# Patient Record
Sex: Female | Born: 1970
Health system: Southern US, Community
[De-identification: ages and names within clinical notes are randomized; demographics above are authoritative.]

## PROBLEM LIST (undated history)

## (undated) ENCOUNTER — Emergency Department (HOSPITAL_COMMUNITY): Admission: EM | Discharge status: Absent without leave | Payer: Self-pay

## (undated) DIAGNOSIS — J189 Pneumonia, unspecified organism: Secondary | ICD-10-CM

## (undated) DIAGNOSIS — K219 Gastro-esophageal reflux disease without esophagitis: Secondary | ICD-10-CM

## (undated) DIAGNOSIS — E119 Type 2 diabetes mellitus without complications: Secondary | ICD-10-CM

## (undated) DIAGNOSIS — M109 Gout, unspecified: Secondary | ICD-10-CM

## (undated) HISTORY — PX: CHOLECYSTECTOMY: SHX55

## (undated) HISTORY — PX: BREAST SURGERY: SHX581

---

## 2004-08-28 ENCOUNTER — Emergency Department: Payer: Self-pay | Admitting: Emergency Medicine

## 2005-09-24 ENCOUNTER — Other Ambulatory Visit: Payer: Self-pay

## 2005-09-24 ENCOUNTER — Emergency Department: Payer: Self-pay | Admitting: Emergency Medicine

## 2006-10-02 ENCOUNTER — Ambulatory Visit: Payer: Self-pay | Admitting: Family Medicine

## 2006-10-04 ENCOUNTER — Encounter: Payer: Self-pay | Admitting: Family Medicine

## 2006-10-09 ENCOUNTER — Encounter: Payer: Self-pay | Admitting: Family Medicine

## 2007-01-22 ENCOUNTER — Encounter (INDEPENDENT_AMBULATORY_CARE_PROVIDER_SITE_OTHER): Payer: Self-pay | Admitting: Specialist

## 2007-01-22 ENCOUNTER — Ambulatory Visit (HOSPITAL_BASED_OUTPATIENT_CLINIC_OR_DEPARTMENT_OTHER): Admission: RE | Admit: 2007-01-22 | Discharge: 2007-01-23 | Payer: Self-pay | Admitting: Specialist

## 2007-02-01 ENCOUNTER — Emergency Department: Payer: Self-pay | Admitting: Emergency Medicine

## 2007-06-04 ENCOUNTER — Emergency Department: Payer: Self-pay | Admitting: Emergency Medicine

## 2008-09-03 ENCOUNTER — Emergency Department: Payer: Self-pay | Admitting: Emergency Medicine

## 2008-12-16 IMAGING — CR DG THORACIC SPINE 2-3V
1 series · 3 of 3 positions shown · non-contrast
Comparison: none

REASON FOR EXAM: PAIN
COMMENTS:

[Series 1: view not recorded · 0.17mm/px · 3 of 3 slices shown]
[im 1/3]
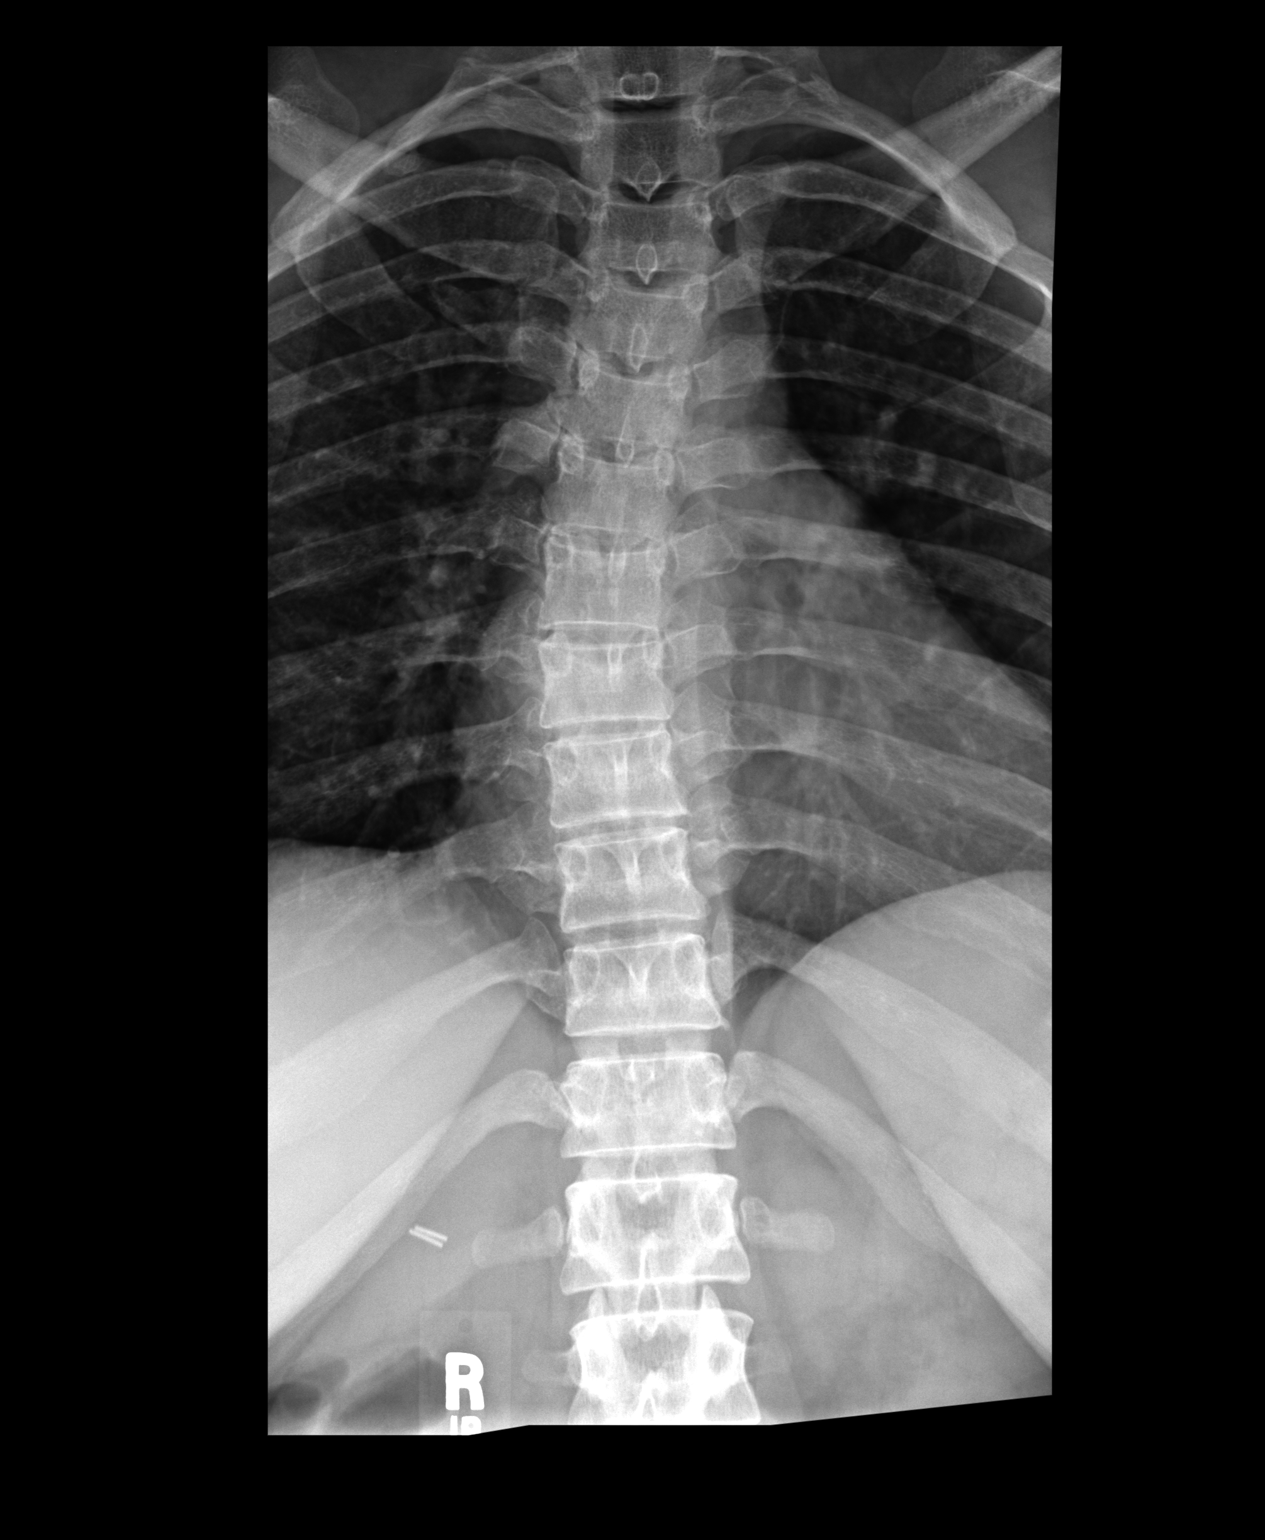
[im 2/3]
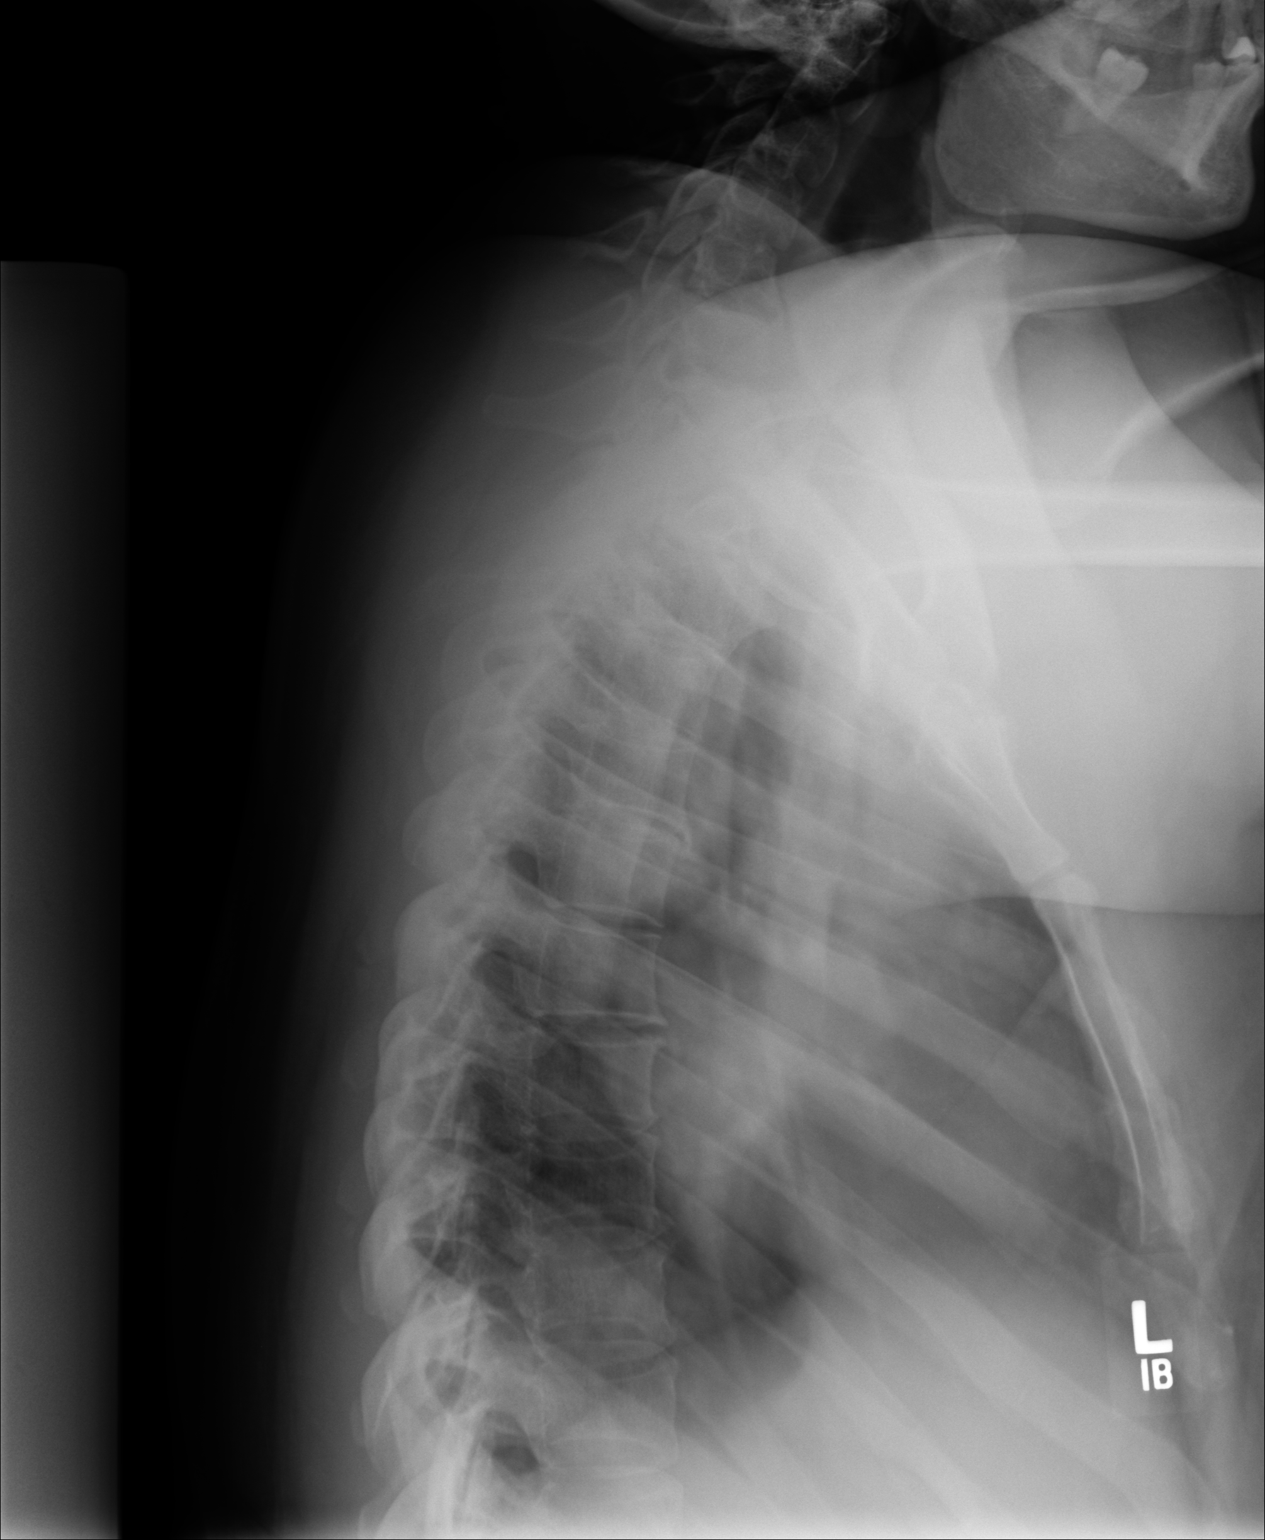
[im 3/3]
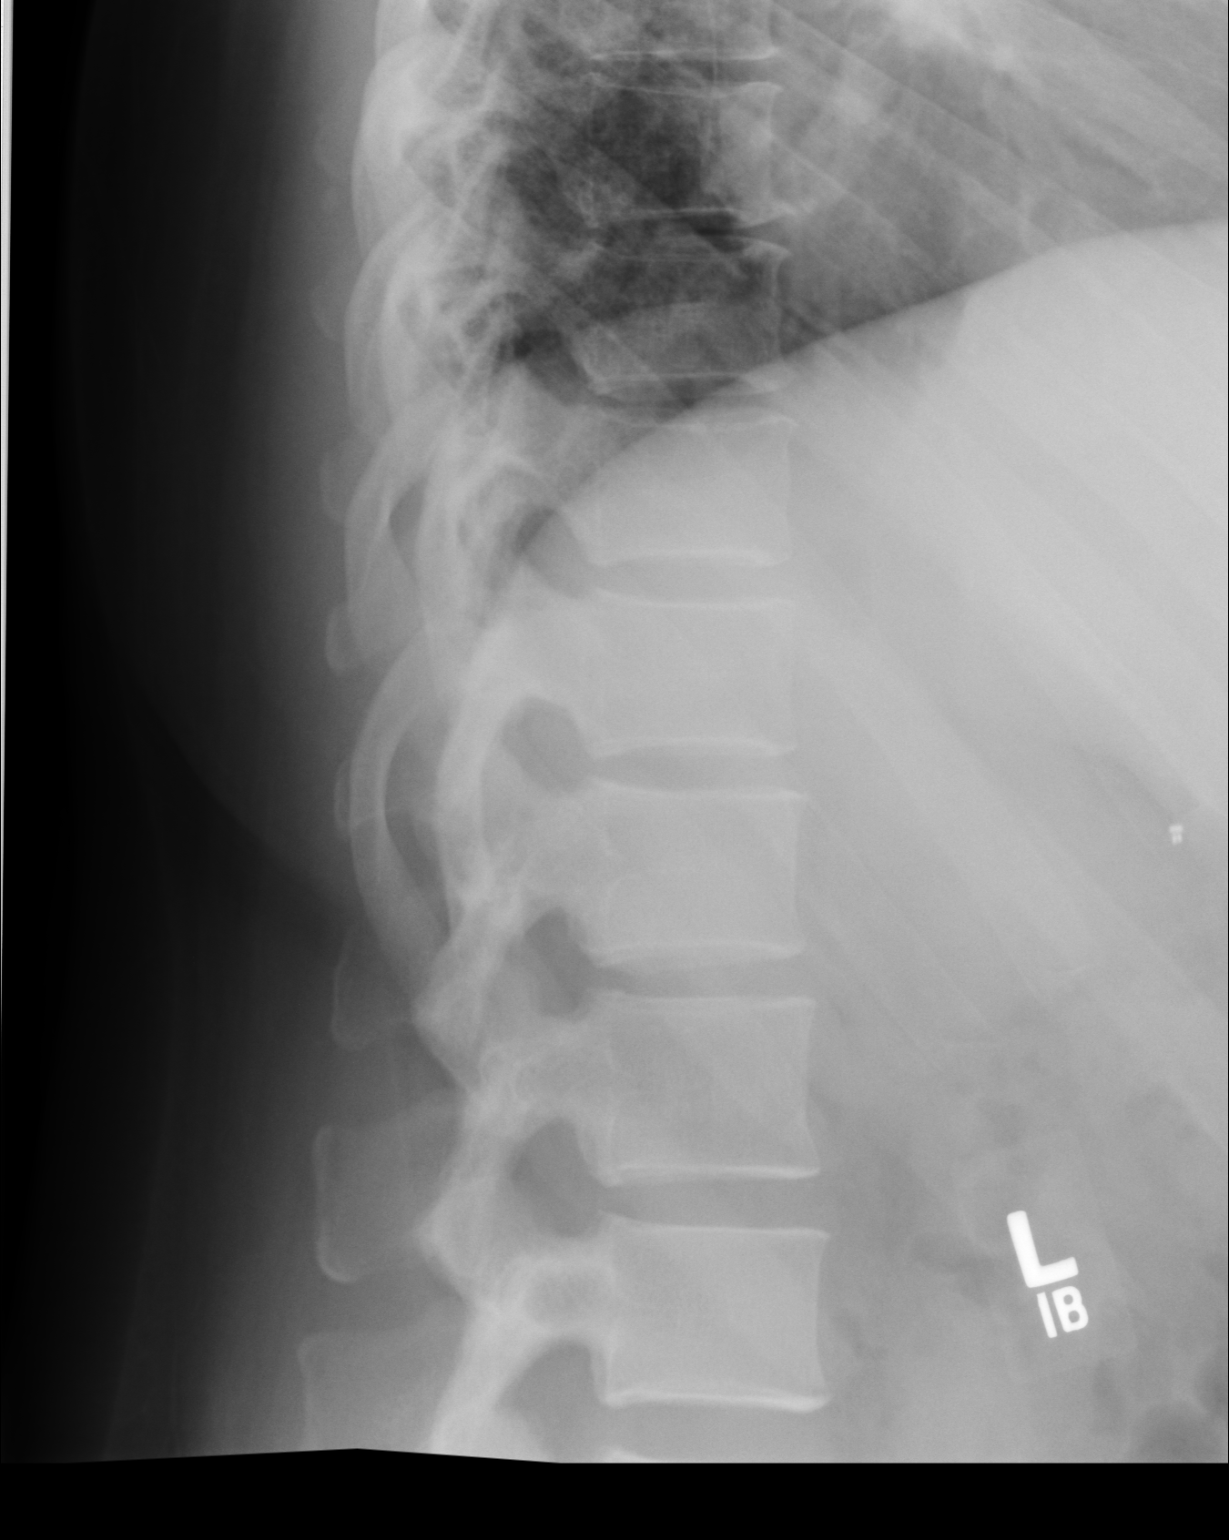

[3 of 3 positions shown; findings below may reference images not displayed]

PROCEDURE:     DXR - DXR THORACIC  AP AND LATERAL  - October 02, 2006 [DATE]

RESULT:     The vertebral body heights and the intervertebral disc spaces
are well maintained. There is a mild thoracic scoliosis with a convexity to
the RIGHT and measuring approximately 14 degrees. In the lateral view, there
is noted slightly hypertrophic spurring anteriorly at multiple levels of the
thoracic spine.
IMPRESSION: 1. No fracture is seen.
2. No lytic or blastic lesions are noted.
3. There is a mild thoracic scoliosis centered at T7 and having the
convexity to the RIGHT.
4. Mild degenerative spurring is noted at multiple levels.

## 2009-09-29 ENCOUNTER — Emergency Department: Payer: Self-pay | Admitting: Emergency Medicine

## 2009-11-27 ENCOUNTER — Ambulatory Visit: Payer: Self-pay | Admitting: Internal Medicine

## 2010-01-26 ENCOUNTER — Ambulatory Visit: Payer: Self-pay | Admitting: Internal Medicine

## 2010-04-27 ENCOUNTER — Emergency Department: Payer: Self-pay | Admitting: Emergency Medicine

## 2010-06-22 NOTE — Op Note (Signed)
NAMEWATEEN, VARON           ACCOUNT NO.:  [id number]   MEDICAL RECORD NO.:  [id number]          PATIENT TYPE:  AMB   LOCATION:  DSC                          FACILITY:  [facility]   PHYSICIAN:  Earvin Hansen L. Truesdale, M.D.DATE OF BIRTH:  January 12, 1971   DATE OF PROCEDURE:  01/22/2007  DATE OF DISCHARGE:                               OPERATIVE REPORT   This 40 year old lady with severe, severe macromastia, back and shoulder  pain secondary to large pendulous breasts, intertriginous changes caused  by increased macromastia as well as increased accessory breast tissue  especially in the upper axillary regions going all around to the  latissimus dorsi areas.  The patient has used multiple talcs, creams,  etc. to try to help her intertriginous changes without improvement.  She  has worn special bras etc. without improvement.   PROCEDURES PLANNED:  Bilateral breast reductions using the inferior  pedicle technique.   Operative procedure in detail as well as attendant risks and possible  complications were explained to the patient preoperatively who consents  to surgery.  Preoperatively, the patient was sat up and drawn for the  inferior pedicle reduction mammoplasty, re-marking the nipple-areolar  complex from over 40 cm to 25 from the suprasternal notch.  She then  underwent general anesthesia intubated orally.  Prep was done to the  chest, breast, abdominal areas using Hibiclens soap and solution and  walled off with sterile towels and drapes so as to make a sterile field.  0.25% Xylocaine with epinephrine 1:400,000 concentration was injected  locally for vasoconstriction.  The wounds were scored with #15 blade and  skin over the inferior pedicle was de-epithelialized with #20 blade.  Medial and lateral fatty dermal pedicles were incised down to underlying  fascia.  After proper hemostasis the new keyhole area was also debulked  of  large volumes of breast tissue.  Laterally excision was  done  directly over accessory breast tissue in the axillary region as well as  latissimus dorsi areas.  After proper hemostasis, the flaps were  transposed and stayed with 3-0 Prolene.  Subcutaneous closure was done  with 3-0 Monocryl x2 layers in running subcuticular stitch of 3-0  Monocryl and 5-0 Monocryl throughout the inverted T.  The wounds were  drained with #10 fully fluted Blake drains which were placed in the  depths of the wound and brought through the lateral-most portion of the  incision and secured with 3-0 Prolene.  The wounds were cleansed.  Steri-  Strips and soft dressing applied all the areas.  Periareolar tissues and  nipple were examined with excellent blood supply and suppleness.  After  sterile dressing were placed and Hypafix the patient was then taken to  the recovery room in excellent condition.  Estimated blood loss less  than 150 mL.  Complications none.      Yaakov Guthrie. Shon Hough, M.D.  Electronically Signed     GLT/MEDQ  D:  01/22/2007  T:  01/23/2007  Job:  [id number]

## 2010-08-03 ENCOUNTER — Ambulatory Visit: Payer: Self-pay | Admitting: Emergency Medicine

## 2010-11-12 LAB — POCT HEMOGLOBIN-HEMACUE: Operator id: 112821

## 2011-05-28 ENCOUNTER — Ambulatory Visit: Payer: Self-pay | Admitting: Internal Medicine

## 2011-07-23 ENCOUNTER — Emergency Department: Payer: Self-pay | Admitting: Emergency Medicine

## 2011-11-22 ENCOUNTER — Emergency Department: Payer: Self-pay | Admitting: Emergency Medicine

## 2012-08-30 ENCOUNTER — Emergency Department: Payer: Self-pay | Admitting: Emergency Medicine

## 2014-12-29 ENCOUNTER — Emergency Department
Admission: EM | Admit: 2014-12-29 | Discharge: 2014-12-29 | Disposition: A | Discharge status: Home / Self Care | Payer: Medicaid Other | Attending: Emergency Medicine | Admitting: Emergency Medicine

## 2014-12-29 ENCOUNTER — Encounter: Payer: Self-pay | Admitting: *Deleted

## 2014-12-29 DIAGNOSIS — S025XXA Fracture of tooth (traumatic), initial encounter for closed fracture: Secondary | ICD-10-CM

## 2014-12-29 DIAGNOSIS — Z880 Allergy status to penicillin: Secondary | ICD-10-CM | POA: Insufficient documentation

## 2014-12-29 DIAGNOSIS — K0381 Cracked tooth: Secondary | ICD-10-CM | POA: Diagnosis not present

## 2014-12-29 DIAGNOSIS — K0889 Other specified disorders of teeth and supporting structures: Secondary | ICD-10-CM | POA: Diagnosis present

## 2014-12-29 DIAGNOSIS — K029 Dental caries, unspecified: Secondary | ICD-10-CM | POA: Diagnosis not present

## 2014-12-29 MED ORDER — IBUPROFEN 800 MG PO TABS: 800.0000 mg | ORAL_TABLET | Freq: Three times a day (TID) | ORAL | Status: DC | PRN

## 2014-12-29 MED ORDER — AZITHROMYCIN 250 MG PO TABS: ORAL_TABLET | ORAL | Status: DC

## 2014-12-29 NOTE — Discharge Instructions (Signed)
Tooth Injuries °Tooth injuries (tooth trauma) include cracked or broken teeth (fractures), teeth that have been moved out of place or dislodged (luxations), and knocked-out teeth (avulsions). °A tooth injury often needs to be treated quickly to save the tooth. However, sometimes it is not possible to save a tooth after an injury, so the tooth may need to be removed (extracted). °CAUSES °Tooth injuries may be caused by any force that is strong enough to chip, break, dislodge, or knock out a tooth. Forces may be due to: °· Sports injuries. °· Falls. °· Accidents. °· Fights. °RISK FACTORS °You may be more likely to injure a tooth if you play a contact sport without using a mouthguard. °SYMPTOMS °A tooth that is forced into the gum may appear dislodged or moved out of position into the tooth socket. A fractured tooth may not be as obvious. Symptoms of a tooth injury include: °· Pain, especially with chewing. °· A loose tooth. °· Bleeding in or around the tooth. °· Swelling or bruising near the tooth. °· Swelling or bruising of the lip over the injured tooth. °· Increased sensitivity to heat and cold. °DIAGNOSIS °A tooth injury can be diagnosed with a medical history and a physical exam. You may also need dental X-rays to check for injuries to the root of the tooth. °TREATMENT °Treatment depends on the type of injury you have and how bad it is. Treatment may need to be done quickly to save your tooth. Possible treatments include: °· Replacing a tooth fragment with a filling, cap, or hard, protective cover (crown). This may be an option for a chip or fracture that does not involve the inside of your tooth (pulp). °· Having a procedure to repair the inside of the tooth (root canal) and then having a crown placed on top. This may be done to treat a tooth fracture that involves the pulp. °· Repositioning a dislodged tooth, then doing a root canal. The root canal usually needs to be done within a few days of the  injury. °· Replacing a knocked-out tooth in the socket, if possible, then doing a root canal a few weeks later. °· Tooth extraction for a fracture that extends below your gumline or splits your tooth completely. °HOME CARE INSTRUCTIONS °· Take medicines only as directed by your dental provider or health care provider. °· Keep all follow-up visits as directed by your dental provider or health care provider. This is important. °· Do not eat or chew on very hard objects. These include ice cubes, pens, pencils, hard candy, and popcorn kernels. °· Do not clench or grind your teeth. Tell your dental provider or health care provider if you grind your teeth while you sleep. °· Apply ice to your mouth near the injured tooth as directed by your dental provider or health care provider. °· Follow instructions about rinsing your mouth with salt water as directed by your dental provider or health care provider. °· Do not use your teeth to open packages. °· Always wear mouth protection when you play contact sports. °SEEK MEDICAL CARE IF: °· You continue to have tooth pain after a tooth injury. °· Your tooth is sensitive to heat and cold. °· You develop swelling near your injured tooth. °· You have a fever. °· You are unable to open your jaw. °· You are drooling and it is getting worse. °  °This information is not intended to replace advice given to you by your health care provider. Make sure you discuss any   questions you have with your health care provider.   Document Released: 10/22/2003 Document Revised: 06/10/2014 Document Reviewed: 01/20/2014 Elsevier Interactive Patient Education 2016 [employer].  OPTIONS FOR DENTAL FOLLOW UP CARE  [place] Department of Health and Human Services - Local Safety Net Dental Clinics [email or web address]   [facility] [phone])  Sharl Ma [phone])  [place] [phone] ext [phone])  [facility] [phone])  [facility] [phone]) This clinic caters to the indigent population and is on a lottery system. Location: [employer] [facility], [employer], [address] [place] Clinic Hours: Wednesdays from 6pm - 9pm, patients seen by a lottery system. For dates, call or go to [email or web address] Services: Cleanings, fillings and simple extractions. Payment Options: DENTAL WORK IS FREE OF CHARGE. Bring proof of income or support. Best way to get seen: Arrive at 5:15 pm - this is a lottery, NOT first come/first serve, so arriving earlier will not increase your chances of being seen.     [facility] [phone] Select option 1 for emergencies   Location: [facility], [place], [address] [place] Clinic Hours: No walk-ins accepted - call the day before to schedule an appointment. Check in times are 9:30 am and 1:30 pm. Services: Simple extractions, temporary fillings, pulpectomy/pulp debridement, uncomplicated abscess drainage. Payment Options: PAYMENT IS DUE AT THE TIME OF SERVICE.  Fee is usually $100-200, additional surgical procedures (e.g. abscess drainage) may be extra. Cash, checks, Visa/MasterCard accepted.  Can file Medicaid if patient is covered for dental - patient should call case worker to check. No discount for [facility] patients. Best way to get seen: MUST call the day before and get onto the schedule. Can usually be seen the next 1-2 days. No walk-ins accepted.     [facility] [phone]   Location: [facility], [address] [place] Clinic Hours: M, W, Th, F 8am or 1:30pm, Tues 9a or 1:30 - first come/first served. Services: Simple extractions, temporary fillings, uncomplicated abscess drainage.  You do not need to be an [facility] resident. Payment Options: PAYMENT IS DUE AT THE TIME  OF SERVICE. Dental insurance, otherwise sliding scale - bring proof of income or support. Depending on income and treatment needed, cost is usually $50-200. Best way to get seen: Arrive early as it is first come/first served.     [facility] [phone]   Location: [address] Clinic Hours: Mon-Thu 8a-5p Services: Most basic dental services including extractions and fillings. Payment Options: PAYMENT IS DUE AT THE TIME OF SERVICE. Sliding scale, up to 50% off - bring proof if income or support. Medicaid with dental option accepted. Best way to get seen: Call to schedule an appointment, can usually be seen within 2 weeks OR they will try to see walk-ins - show up at 8a or 2p (you may have to wait).     [facility] [phone] [place] RESIDENTS ONLY   Location: [facility], [address], [place], [place] [address] Clinic Hours: By appointment only. Monday - Thursday 8am-5pm, Friday 8am-12pm Services: Cleanings, fillings, extractions. Payment Options: PAYMENT IS DUE AT THE TIME OF SERVICE. Cash, Visa or MasterCard. Sliding scale - $30 minimum per service. Best way to get seen: Come in to office, complete packet and make an appointment - need proof of income or support monies for each household member and proof  of [place] residence. Usually takes about a month to get in.     [facility] [phone]   Location: [address] [facility] Hours: Walk-in Urgent Care Dental Services are offered Monday-Friday mornings only. The numbers of emergencies accepted daily is limited to the number of providers available. Maximum 15 - Mondays, Wednesdays & Thursdays Maximum 10 - Tuesdays & Fridays Services: You do not need to be a [place] resident to be seen for a dental emergency. Emergencies are defined as pain, swelling, abnormal  bleeding, or dental trauma. Walkins will receive x-rays if needed. NOTE: Dental cleaning is not an emergency. Payment Options: PAYMENT IS DUE AT THE TIME OF SERVICE. Minimum co-pay is $40.00 for uninsured patients. Minimum co-pay is $3.00 for Medicaid with dental coverage. Dental Insurance is accepted and must be presented at time of visit. Medicare does not cover dental. Forms of payment: Cash, credit card, checks. Best way to get seen: If not previously registered with the clinic, walk-in dental registration begins at 7:15 am and is on a first come/first serve basis. If previously registered with the clinic, call to make an appointment.     [facility] [phone] [place] RESIDENTS ONLY   Location: [address], [place], [place] Clinic Hours: Mon-Thu 10a-2p Services: Extractions only! Payment Options: FREE (donations accepted) - bring proof of income or support Best way to get seen: Call and schedule an appointment OR come at 8am on the 1st Monday of every month (except for holidays) when it is first come/first served.     [facility] [phone]   Location: [address], [place] Clinic Hours: Friday mornings Services, Payment Options, Best way to get seen: Call for info

## 2014-12-29 NOTE — ED Notes (Signed)
Left lower dental pain x 1 week.  Reports that she chipped her tooth.  No swelling noted.

## 2014-12-29 NOTE — ED Provider Notes (Signed)
[facility] Emergency Department Provider Note  ____________________________________________  Time seen: Approximately 7:38 PM  I have reviewed the triage vital signs and the nursing notes.   HISTORY  Chief Complaint Dental Pain    HPI Cynthia Parsons is a 44 y.o. female who presents for evaluation of dental pain. Patient states that she chipped tooth and has been pain for 1 week.   History reviewed. No pertinent past medical history.  There are no active problems to display for this patient.   History reviewed. No pertinent past surgical history.  Current Outpatient Rx  Name  Route  Sig  Dispense  Refill  . azithromycin (ZITHROMAX Z-PAK) 250 MG tablet      Take 2 tablets (500 mg) on  Day 1,  followed by 1 tablet (250 mg) once daily on Days 2 through 5.   6 each   0   . ibuprofen (ADVIL,MOTRIN) 800 MG tablet   Oral   Take 1 tablet (800 mg total) by mouth every 8 (eight) hours as needed.   30 tablet   0     Allergies Penicillins  History reviewed. No pertinent family history.  Social History Social History  Substance Use Topics  . Smoking status: Never Smoker   . Smokeless tobacco: None  . Alcohol Use: No    Review of Systems Constitutional: No fever/chills Eyes: No visual changes. ENT: No sore throat. Positive for dental pain Cardiovascular: Denies chest pain. Respiratory: Denies shortness of breath. Gastrointestinal: No abdominal pain.  No nausea, no vomiting.  No diarrhea.  No constipation. Genitourinary: Negative for dysuria. Musculoskeletal: Negative for back pain. Skin: Negative for rash. Neurological: Negative for headaches, focal weakness or numbness.  10-point ROS otherwise negative.  ____________________________________________   PHYSICAL EXAM:  VITAL SIGNS: ED Triage Vitals  Enc Vitals Group     BP 12/29/14 1920 123/70 mmHg     Pulse Rate 12/29/14 1920 74     Resp 12/29/14 1920 18     Temp 12/29/14  1920 98.3 F (36.8 C)     Temp Source 12/29/14 1920 Oral     SpO2 12/29/14 1920 100 %     Weight 12/29/14 1920 220 lb (99.791 kg)     Height 12/29/14 1920  (1.676 m)     Head Cir --      Peak Flow --      Pain Score 12/29/14 1916 5     Pain Loc --      Pain Edu? --      Excl. in GC? --     Constitutional: Alert and oriented. Well appearing and in no acute distress. Head: Atraumatic. Nose: No congestion/rhinnorhea. Mouth/Throat: Mucous membranes are moist.  Oropharynx non-erythematous. Fractured tooth left lower molar. No evidence of erythema or edema. Positive dental caries. Neck: No stridor.   Cardiovascular: Normal rate, regular rhythm. Grossly normal heart sounds.  Good peripheral circulation. Respiratory: Normal respiratory effort.  No retractions. Lungs CTAB. Neurologic:  Normal speech and language. No gross focal neurologic deficits are appreciated. No gait instability. Skin:  Skin is warm, dry and intact. No rash noted. Psychiatric: Mood and affect are normal. Speech and behavior are normal.  ____________________________________________   LABS (all labs ordered are listed, but only abnormal results are displayed)  Labs Reviewed - No data to display ____________________________________________  PROCEDURES  Procedure(s) performed: None  Critical Care performed: No  ____________________________________________   INITIAL IMPRESSION / ASSESSMENT AND PLAN / ED COURSE  Pertinent labs &  imaging results that were available during my care of the patient were reviewed by me and considered in my medical decision making (see chart for details).  Acute dental pain. Rx given for Motrin 800 mg 3 times a day and Z-Pak. Patient given a list of dental providers in the area. She is to follow-up with PCP or return to the ER as needed. ____________________________________________   FINAL CLINICAL IMPRESSION(S) / ED DIAGNOSES  Final diagnoses:  Fractured tooth, closed,  initial encounter      Evangeline Dakinharles M Rafik Koppel, PA-C 12/29/14 2309  Myrna Blazeravid Matthew Schaevitz, MD 12/29/14 [phone]

## 2016-05-19 ENCOUNTER — Emergency Department
Admission: EM | Admit: 2016-05-19 | Discharge: 2016-05-19 | Disposition: A | Discharge status: Home / Self Care | Payer: Medicaid Other | Attending: Emergency Medicine | Admitting: Emergency Medicine

## 2016-05-19 ENCOUNTER — Encounter: Payer: Self-pay | Admitting: Emergency Medicine

## 2016-05-19 ENCOUNTER — Emergency Department: Payer: Medicaid Other

## 2016-05-19 DIAGNOSIS — M19079 Primary osteoarthritis, unspecified ankle and foot: Secondary | ICD-10-CM

## 2016-05-19 DIAGNOSIS — M79671 Pain in right foot: Secondary | ICD-10-CM | POA: Diagnosis present

## 2016-05-19 DIAGNOSIS — Z792 Long term (current) use of antibiotics: Secondary | ICD-10-CM | POA: Insufficient documentation

## 2016-05-19 DIAGNOSIS — M19071 Primary osteoarthritis, right ankle and foot: Secondary | ICD-10-CM | POA: Diagnosis not present

## 2016-05-19 MED ORDER — PREDNISONE 20 MG PO TABS
60.0000 mg | ORAL_TABLET | Freq: Once | ORAL | Status: AC
  Administered 2016-05-19: 60 mg via ORAL
  Filled 2016-05-19: qty 3

## 2016-05-19 MED ORDER — PREDNISONE 10 MG PO TABS: 10.0000 mg | ORAL_TABLET | Freq: Every day | ORAL | 0 refills | Status: DC

## 2016-05-19 NOTE — Discharge Instructions (Signed)
Please rest ice and elevate the foot. Use crutches as needed for ambulation. Take prednisone as prescribed.

## 2016-05-19 NOTE — ED Notes (Signed)
See triage note  States she developed pain to right foot on Sunday  Denies any injury  Able to walk with a limp  No  Deformity noted

## 2016-05-19 NOTE — ED Provider Notes (Signed)
[facility]-EMERGENCY DEPARTMENT Provider Note   CSN: [id number] Arrival date & time: 05/19/16  1814     History   Chief Complaint Chief Complaint  Patient presents with  . Foot Pain    HPI Cynthia Parsons is a 46 y.o. female presents for evaluation of acute right foot pain along the first MTP joint of the right foot. Pain has been present for 5 days. She denies any significant trauma. She is not taking any medications for pain. No numbness or tingling. Denies any fevers or open wounds. Pain is to the point where she is having a hard time ambulating.   HPI  History reviewed. No pertinent past medical history.  There are no active problems to display for this patient.   No past surgical history on file.  OB History    No data available       Home Medications    Prior to Admission medications   Medication Sig Start Date End Date Taking? Authorizing Provider  azithromycin (ZITHROMAX Z-PAK) 250 MG tablet Take 2 tablets (500 mg) on  Day 1,  followed by 1 tablet (250 mg) once daily on Days 2 through 5. 12/29/14   Evangeline Dakin, PA-C  ibuprofen (ADVIL,MOTRIN) 800 MG tablet Take 1 tablet (800 mg total) by mouth every 8 (eight) hours as needed. 12/29/14   Charmayne Sheer Beers, PA-C  predniSONE (DELTASONE) 10 MG tablet Take 1 tablet (10 mg total) by mouth daily. 6,5,4,3,2,1 six day taper 05/19/16   Evon Slack, PA-C    Family History No family history on file.  Social History Social History  Substance Use Topics  . Smoking status: Never Smoker  . Smokeless tobacco: Not on file  . Alcohol use No     Allergies   Penicillins   Review of Systems Review of Systems  Constitutional: Negative for fever.  Musculoskeletal: Positive for arthralgias, gait problem and joint swelling.     Physical Exam Updated Vital Signs BP (!) 120/95 (BP Location: Left Arm)   Pulse (!) 106   Temp 98.5 F (36.9 C) (Oral)   Resp 18   Ht  (1.676 m)   Wt 99.8 kg   LMP 04/18/2016  Comment: tubes tied  SpO2 98%   BMI 35.51 kg/m   Physical Exam  Constitutional: She appears well-developed and well-nourished.  HENT:  Head: Normocephalic and atraumatic.  Eyes: Conjunctivae and EOM are normal.  Neck: Normal range of motion.  Cardiovascular: Intact distal pulses.   Pulmonary/Chest: No respiratory distress.  Musculoskeletal:  Examination of the right foot shows patient has a bunion deformity at the present to be joint. She is tender slightly dorsally along the MTP joint and along the plantar aspect. Sensation is intact distally. 2+ dorsalis pedis pulse. No warmth or redness.     ED Treatments / Results  Labs (all labs ordered are listed, but only abnormal results are displayed) Labs Reviewed - No data to display  EKG  EKG Interpretation None       Radiology Dg Foot Complete Right  Result Date: 05/19/2016 CLINICAL DATA:  Right foot pain since Sunday. EXAM: RIGHT FOOT COMPLETE - 3+ VIEW COMPARISON:  None. FINDINGS: The joint spaces are maintained. There are mild degenerative changes at the first MTP joint with spurring and subchondral cyst versus erosion. The other MTP joints are normal. No midfoot or hindfoot degenerative changes. Small calcaneal heel spur. IMPRESSION: Mild degenerative changes at the first MTP joint with possible erosions. Could not exclude the  possibility of gout. No acute bony findings. Electronically Signed   By: Rudie Meyer M.D.   On: 05/19/2016 20:11    Procedures Procedures (including critical care time)  Medications Ordered in ED Medications  predniSONE (DELTASONE) tablet 60 mg (not administered)     Initial Impression / Assessment and Plan / ED Course  I have reviewed the triage vital signs and the nursing notes.  Pertinent labs & imaging results that were available during my care of the patient were reviewed by me and considered in my medical decision making (see chart for details).     46 year old female with arthritic  changes along the first MTP joint. She is started on a prednisone taper. She is given crutches to help with ambulation. She'll follow-up with podiatry if no improvement.  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 6,5,4,3,2,1 six day taper     Evon Slack, PA-C 05/19/16 2101    Phineas Semen, MD 05/19/16 2212

## 2016-05-19 NOTE — ED Triage Notes (Signed)
Pt reports right foot pain since Sunday. Pt denies injury.

## 2017-09-28 DIAGNOSIS — M109 Gout, unspecified: Secondary | ICD-10-CM

## 2017-09-28 HISTORY — DX: Gout, unspecified: M10.9

## 2017-09-29 ENCOUNTER — Encounter (HOSPITAL_COMMUNITY): Payer: Self-pay | Admitting: Emergency Medicine

## 2017-09-29 ENCOUNTER — Emergency Department (HOSPITAL_COMMUNITY)
Admission: EM | Admit: 2017-09-29 | Discharge: 2017-09-29 | Disposition: A | Discharge status: Home / Self Care | Payer: Medicaid Other | Attending: Emergency Medicine | Admitting: Emergency Medicine

## 2017-09-29 DIAGNOSIS — M62830 Muscle spasm of back: Secondary | ICD-10-CM

## 2017-09-29 DIAGNOSIS — M546 Pain in thoracic spine: Secondary | ICD-10-CM | POA: Diagnosis present

## 2017-09-29 HISTORY — DX: Gout, unspecified: M10.9

## 2017-09-29 MED ORDER — METHOCARBAMOL 500 MG PO TABS: 500.0000 mg | ORAL_TABLET | Freq: Three times a day (TID) | ORAL | 0 refills | Status: DC | PRN

## 2017-09-29 MED ORDER — OXYCODONE-ACETAMINOPHEN 5-325 MG PO TABS
1.0000 | ORAL_TABLET | Freq: Once | ORAL | Status: AC
  Administered 2017-09-29: 1 via ORAL
  Filled 2017-09-29: qty 1

## 2017-09-29 MED ORDER — ACETAMINOPHEN 325 MG PO TABS
650.0000 mg | ORAL_TABLET | Freq: Once | ORAL | Status: AC
  Administered 2017-09-29: 650 mg via ORAL
  Filled 2017-09-29: qty 2

## 2017-09-29 MED ORDER — METHOCARBAMOL 500 MG PO TABS
500.0000 mg | ORAL_TABLET | Freq: Once | ORAL | Status: AC
  Administered 2017-09-29: 500 mg via ORAL
  Filled 2017-09-29: qty 1

## 2017-09-29 MED ORDER — KETOROLAC TROMETHAMINE 60 MG/2ML IM SOLN
60.0000 mg | Freq: Once | INTRAMUSCULAR | Status: AC
  Administered 2017-09-29: 60 mg via INTRAMUSCULAR
  Filled 2017-09-29: qty 2

## 2017-09-29 MED ORDER — PREDNISONE 20 MG PO TABS: 40.0000 mg | ORAL_TABLET | Freq: Every day | ORAL | 0 refills | Status: AC

## 2017-09-29 MED ORDER — IBUPROFEN 600 MG PO TABS: 600.0000 mg | ORAL_TABLET | Freq: Three times a day (TID) | ORAL | 0 refills | Status: DC | PRN

## 2017-09-29 MED FILL — predniSONE 20 MG TABS: 20 | 5 days supply | Qty: 10 | Fill #0

## 2017-09-29 MED FILL — IBUPROFEN 600 MG TABLET: 600 | 5 days supply | Qty: 15 | Fill #0

## 2017-09-29 MED FILL — METHOCARBAMOL 500 MG TABLET: 500 | 4 days supply | Qty: 12 | Fill #0
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
## 2017-09-29 NOTE — ED Provider Notes (Signed)
[facility]-EMERGENCY DEPT Provider Note   CSN: [id number] Arrival date & time: 09/29/17  0746     History   Chief Complaint Chief Complaint  Patient presents with  . Back Pain    HPI Cynthia Parsons is a 47 y.o. female.  HPI [age] year old female who presents with acute left upper back pain.  Is been present over the past several days.  This morning it significantly worsened.  She tried over-the-counter medications without improvement in her symptoms.  No weakness of her arms or legs.  No fevers or chills.  No chest pain or shortness of breath.  She states is located under her left scapula is worse with movement and palpation of that region.  She is currently stating that the heat pad which is present is helping.   Past Medical History:  Diagnosis Date  . Gout 09/28/2017    There are no active problems to display for this patient.   History reviewed. No pertinent surgical history.   OB History   None      Home Medications    Prior to Admission medications   Medication Sig Start Date End Date Taking? Authorizing Provider  ibuprofen (ADVIL,MOTRIN) 600 MG tablet Take 1 tablet (600 mg total) by mouth every 8 (eight) hours as needed. 09/29/17   Azalia Bilisampos, Nayel Purdy, MD  methocarbamol (ROBAXIN) 500 MG tablet Take 1 tablet (500 mg total) by mouth every 8 (eight) hours as needed for muscle spasms. 09/29/17   Azalia Bilisampos, Susumu Hackler, MD  predniSONE (DELTASONE) 20 MG tablet Take 2 tablets (40 mg total) by mouth daily for 5 days. 09/29/17 10/04/17  Azalia Bilisampos, Amaiyah Nordhoff, MD    Family History Family History  Problem Relation Age of Onset  . Hypertension Mother   . Diabetes Mother   . Stroke Mother   . Hypertension Father   . Diabetes Father     Social History Social History   Tobacco Use  . Smoking status: Never Smoker  . Smokeless tobacco: Never Used  Substance Use Topics  . Alcohol use: No  . Drug use: No     Allergies   Penicillins   Review of Systems Review  of Systems  All other systems reviewed and are negative.    Physical Exam Updated Vital Signs BP (!) 151/94 (BP Location: Left Arm)   Pulse 71   Temp 98.1 F (36.7 C) (Oral)   Resp 18   Ht 5\' 6"  (1.676 m)   Wt 106.1 kg   LMP 08/29/2017   SpO2 97%   BMI 37.77 kg/m   Physical Exam  Constitutional: She is oriented to person, place, and time. She appears well-developed and well-nourished.  HENT:  Head: Normocephalic.  Eyes: EOM are normal.  Neck: Normal range of motion.  Cardiovascular: Normal rate and regular rhythm.  Pulmonary/Chest: Effort normal and breath sounds normal.  Abdominal: Soft. She exhibits no distension.  Musculoskeletal: Normal range of motion.  Parathoracic tenderness on the left without significant spasm.  Neurological: She is alert and oriented to person, place, and time.  Psychiatric: She has a normal mood and affect.  Nursing note and vitals reviewed.    ED Treatments / Results  Labs (all labs ordered are listed, but only abnormal results are displayed) Labs Reviewed - No data to display  EKG None  Radiology No results found.  Procedures Procedures (including critical care time)  Medications Ordered in ED Medications  ketorolac (TORADOL) injection 60 mg (60 mg Intramuscular Given 09/29/17 0829)  acetaminophen (  TYLENOL) tablet 650 mg (650 mg Oral Given 09/29/17 0829)  oxyCODONE-acetaminophen (PERCOCET/ROXICET) 5-325 MG per tablet 1 tablet (1 tablet Oral Given 09/29/17 0829)  methocarbamol (ROBAXIN) tablet 500 mg (500 mg Oral Given 09/29/17 1610)     Initial Impression / Assessment and Plan / ED Course  I have reviewed the triage vital signs and the nursing notes.  Pertinent labs & imaging results that were available during my care of the patient were reviewed by me and considered in my medical decision making (see chart for details).     Patient is overall well-appearing.  Discharged home in good condition.  Likely muscular skeletal pain.   No indication for advanced imaging.  Patient understands return to the emergency department for new or worsening symptoms  Final Clinical Impressions(s) / ED Diagnoses   Final diagnoses:  Spasm of thoracic back muscle  Acute left-sided thoracic back pain    ED Discharge Orders         Ordered    methocarbamol (ROBAXIN) 500 MG tablet  Every 8 hours PRN     09/29/17 0850    ibuprofen (ADVIL,MOTRIN) 600 MG tablet  Every 8 hours PRN     09/29/17 0850    predniSONE (DELTASONE) 20 MG tablet  Daily     09/29/17 0850           Azalia Bilis, MD 09/29/17 1625

## 2017-09-29 NOTE — ED Notes (Signed)
HEAT PACK PUT IN PLACE TO LOOSEN MUSCLES

## 2017-09-29 NOTE — ED Notes (Signed)
ED Provider at bedside. 

## 2017-09-29 NOTE — ED Notes (Signed)
Bed: WA21 Expected date:  Expected time:  Means of arrival:  Comments: EMS back pain 

## 2018-12-24 ENCOUNTER — Other Ambulatory Visit: Payer: Self-pay

## 2018-12-24 DIAGNOSIS — Z20822 Contact with and (suspected) exposure to covid-19: Secondary | ICD-10-CM

## 2018-12-26 ENCOUNTER — Telehealth: Payer: Self-pay | Admitting: *Deleted

## 2018-12-26 LAB — NOVEL CORONAVIRUS, NAA: SARS-CoV-2, NAA: DETECTED — AB

## 2018-12-26 NOTE — Telephone Encounter (Signed)
Provided patient name and positive Covid19 results. Patient reported she was having difficulty breathing and high temperature.  Advised ED at this time with a mask on and with keeping her distance and report immediatly upon arrival she is positive Covid. Stated she understood.

## 2018-12-26 NOTE — ED Notes (Signed)
Per triage RN-states patient is severely SOB-covid pos

## 2018-12-28 ENCOUNTER — Other Ambulatory Visit: Payer: Self-pay

## 2018-12-28 ENCOUNTER — Encounter (HOSPITAL_COMMUNITY): Payer: Self-pay | Admitting: Emergency Medicine

## 2018-12-28 ENCOUNTER — Emergency Department (HOSPITAL_COMMUNITY): Payer: HRSA Program

## 2018-12-28 ENCOUNTER — Inpatient Hospital Stay (HOSPITAL_COMMUNITY)
Admission: EM | Admit: 2018-12-28 | Discharge: 2019-01-01 | DRG: 177 | Disposition: A | Discharge status: Home / Self Care | Payer: HRSA Program | Attending: Family Medicine | Admitting: Family Medicine

## 2018-12-28 DIAGNOSIS — J9601 Acute respiratory failure with hypoxia: Secondary | ICD-10-CM | POA: Diagnosis present

## 2018-12-28 DIAGNOSIS — D509 Iron deficiency anemia, unspecified: Secondary | ICD-10-CM | POA: Diagnosis present

## 2018-12-28 DIAGNOSIS — E876 Hypokalemia: Secondary | ICD-10-CM | POA: Diagnosis present

## 2018-12-28 DIAGNOSIS — E860 Dehydration: Secondary | ICD-10-CM | POA: Diagnosis present

## 2018-12-28 DIAGNOSIS — J9600 Acute respiratory failure, unspecified whether with hypoxia or hypercapnia: Secondary | ICD-10-CM | POA: Diagnosis not present

## 2018-12-28 DIAGNOSIS — J1289 Other viral pneumonia: Secondary | ICD-10-CM | POA: Diagnosis present

## 2018-12-28 DIAGNOSIS — Z880 Allergy status to penicillin: Secondary | ICD-10-CM

## 2018-12-28 DIAGNOSIS — Z6837 Body mass index (BMI) 37.0-37.9, adult: Secondary | ICD-10-CM

## 2018-12-28 DIAGNOSIS — A0839 Other viral enteritis: Secondary | ICD-10-CM | POA: Diagnosis present

## 2018-12-28 DIAGNOSIS — E669 Obesity, unspecified: Secondary | ICD-10-CM | POA: Diagnosis present

## 2018-12-28 DIAGNOSIS — R197 Diarrhea, unspecified: Secondary | ICD-10-CM | POA: Diagnosis present

## 2018-12-28 DIAGNOSIS — U071 COVID-19: Secondary | ICD-10-CM | POA: Diagnosis not present

## 2018-12-28 DIAGNOSIS — R0602 Shortness of breath: Secondary | ICD-10-CM | POA: Diagnosis present

## 2018-12-28 DIAGNOSIS — A09 Infectious gastroenteritis and colitis, unspecified: Secondary | ICD-10-CM | POA: Diagnosis not present

## 2018-12-28 LAB — COMPREHENSIVE METABOLIC PANEL
ALT: 18 U/L (ref 0–44)
AST: 24 U/L (ref 15–41)
Albumin: 3.2 g/dL — ABNORMAL LOW (ref 3.5–5.0)
Alkaline Phosphatase: 113 U/L (ref 38–126)
Anion gap: 9 (ref 5–15)
BUN: 5 mg/dL — ABNORMAL LOW (ref 6–20)
CO2: 25 mmol/L (ref 22–32)
Calcium: 8.5 mg/dL — ABNORMAL LOW (ref 8.9–10.3)
Chloride: 102 mmol/L (ref 98–111)
Creatinine, Ser: 0.7 mg/dL (ref 0.44–1.00)
GFR calc Af Amer: >60 mL/min (ref >60)
GFR calc non Af Amer: >60 mL/min (ref >60)
Glucose, Bld: 214 mg/dL — ABNORMAL HIGH (ref 70–99)
Potassium: 3.4 mmol/L — ABNORMAL LOW (ref 3.5–5.1)
Sodium: 136 mmol/L (ref 135–145)
Total Bilirubin: 0.3 mg/dL (ref 0.3–1.2)
Total Protein: 7.2 g/dL (ref 6.5–8.1)

## 2018-12-28 LAB — CBC WITH DIFFERENTIAL/PLATELET
Abs Immature Granulocytes: 0.03 10*3/uL (ref 0.00–0.07)
Basophils Absolute: 0 10*3/uL (ref 0.0–0.1)
Basophils Relative: 0 %
Eosinophils Absolute: 0 10*3/uL (ref 0.0–0.5)
Eosinophils Relative: 0 %
HCT: 37.6 % (ref 36.0–46.0)
Hemoglobin: 10.4 g/dL — ABNORMAL LOW (ref 12.0–15.0)
Immature Granulocytes: 1 %
Lymphocytes Relative: 17 %
Lymphs Abs: 1 10*3/uL (ref 0.7–4.0)
MCH: 18.5 pg — ABNORMAL LOW (ref 26.0–34.0)
MCHC: 27.7 g/dL — ABNORMAL LOW (ref 30.0–36.0)
MCV: 66.8 fL — ABNORMAL LOW (ref 80.0–100.0)
Monocytes Absolute: 0.3 10*3/uL (ref 0.1–1.0)
Monocytes Relative: 6 %
Neutro Abs: 4.4 10*3/uL (ref 1.7–7.7)
Neutrophils Relative %: 76 %
Platelets: 316 10*3/uL (ref 150–400)
RBC: 5.63 MIL/uL — ABNORMAL HIGH (ref 3.87–5.11)
RDW: 17.7 % — ABNORMAL HIGH (ref 11.5–15.5)
WBC: 5.7 10*3/uL (ref 4.0–10.5)
nRBC: 0 % (ref 0.0–0.2)

## 2018-12-28 LAB — C-REACTIVE PROTEIN: CRP: 7 mg/dL — ABNORMAL HIGH (ref <1.0)

## 2018-12-28 LAB — D-DIMER, QUANTITATIVE: D-Dimer, Quant: 0.49 ug/mL-FEU (ref 0.00–0.50)

## 2018-12-28 LAB — BRAIN NATRIURETIC PEPTIDE: B Natriuretic Peptide: 23.1 pg/mL (ref 0.0–100.0)

## 2018-12-28 LAB — HIV ANTIBODY (ROUTINE TESTING W REFLEX): HIV Screen 4th Generation wRfx: NONREACTIVE

## 2018-12-28 LAB — FERRITIN: Ferritin: 12 ng/mL (ref 11–307)

## 2018-12-28 LAB — PROCALCITONIN: Procalcitonin: <0.1 ng/mL

## 2018-12-28 LAB — FIBRINOGEN: Fibrinogen: 458 mg/dL (ref 210–475)

## 2018-12-28 LAB — TRIGLYCERIDES: Triglycerides: 82 mg/dL (ref <150)

## 2018-12-28 LAB — IRON AND TIBC
Iron: 17 ug/dL — ABNORMAL LOW (ref 28–170)
Saturation Ratios: 4 % — ABNORMAL LOW (ref 10.4–31.8)
TIBC: 391 ug/dL (ref 250–450)
UIBC: 374 ug/dL

## 2018-12-28 LAB — LACTIC ACID, PLASMA: Lactic Acid, Venous: 1.3 mmol/L (ref 0.5–1.9)

## 2018-12-28 LAB — TROPONIN I (HIGH SENSITIVITY): Troponin I (High Sensitivity): 3 ng/L (ref <18)

## 2018-12-28 LAB — LACTATE DEHYDROGENASE: LDH: 218 U/L — ABNORMAL HIGH (ref 98–192)

## 2018-12-28 MED ORDER — SODIUM CHLORIDE 0.9 % IV BOLUS
1000.0000 mL | Freq: Once | INTRAVENOUS | Status: AC
  Administered 2018-12-28: 1000 mL via INTRAVENOUS

## 2018-12-28 MED ORDER — ENOXAPARIN SODIUM 60 MG/0.6ML ~~LOC~~ SOLN
55.0000 mg | SUBCUTANEOUS | Status: DC
  Administered 2018-12-28 – 2018-12-31 (×4): 55 mg via SUBCUTANEOUS
  Filled 2018-12-28 (×2): qty 0.6
  Filled 2018-12-28: qty 0.55
  Filled 2018-12-28 (×2): qty 0.6

## 2018-12-28 MED ORDER — ONDANSETRON HCL 4 MG/2ML IJ SOLN: 4.0000 mg | Freq: Four times a day (QID) | INTRAMUSCULAR | Status: DC | PRN

## 2018-12-28 MED ORDER — SODIUM CHLORIDE 0.9 % IV SOLN
200.0000 mg | Freq: Once | INTRAVENOUS | Status: AC
  Administered 2018-12-28: 200 mg via INTRAVENOUS
  Filled 2018-12-28: qty 40

## 2018-12-28 MED ORDER — LEVOFLOXACIN IN D5W 750 MG/150ML IV SOLN
750.0000 mg | Freq: Once | INTRAVENOUS | Status: AC
  Administered 2018-12-28: 750 mg via INTRAVENOUS
  Filled 2018-12-28: qty 150

## 2018-12-28 MED ORDER — SODIUM CHLORIDE 0.9 % IV SOLN
100.0000 mg | INTRAVENOUS | Status: AC
  Administered 2018-12-30 – 2019-01-01 (×3): 100 mg via INTRAVENOUS
  Filled 2018-12-28 (×3): qty 20

## 2018-12-28 MED ORDER — SODIUM CHLORIDE 0.9 % IV SOLN
100.0000 mg | INTRAVENOUS | Status: AC
  Administered 2018-12-29: 100 mg via INTRAVENOUS
  Filled 2018-12-28: qty 20

## 2018-12-28 MED ORDER — ACETAMINOPHEN 325 MG PO TABS: 650.0000 mg | ORAL_TABLET | Freq: Four times a day (QID) | ORAL | Status: DC | PRN

## 2018-12-28 MED ORDER — PROCHLORPERAZINE EDISYLATE 10 MG/2ML IJ SOLN
10.0000 mg | Freq: Once | INTRAMUSCULAR | Status: AC
  Administered 2018-12-28: 10 mg via INTRAVENOUS
  Filled 2018-12-28: qty 2

## 2018-12-28 MED ORDER — ZINC SULFATE 220 (50 ZN) MG PO CAPS
220.0000 mg | ORAL_CAPSULE | Freq: Every day | ORAL | Status: DC
  Administered 2018-12-28 – 2019-01-01 (×5): 220 mg via ORAL
  Filled 2018-12-28 (×5): qty 1

## 2018-12-28 MED ORDER — DEXAMETHASONE SODIUM PHOSPHATE 10 MG/ML IJ SOLN
6.0000 mg | Freq: Every day | INTRAMUSCULAR | Status: DC
  Administered 2018-12-29 – 2019-01-01 (×4): 6 mg via INTRAVENOUS
  Filled 2018-12-28 (×4): qty 1

## 2018-12-28 MED ORDER — VITAMIN C 500 MG PO TABS
500.0000 mg | ORAL_TABLET | Freq: Two times a day (BID) | ORAL | Status: DC
  Administered 2018-12-28 – 2019-01-01 (×8): 500 mg via ORAL
  Filled 2018-12-28 (×8): qty 1

## 2018-12-28 MED ORDER — ACETAMINOPHEN 500 MG PO TABS
1000.0000 mg | ORAL_TABLET | Freq: Once | ORAL | Status: AC
  Administered 2018-12-28: 15:00:00 1000 mg via ORAL
  Filled 2018-12-28: qty 2

## 2018-12-28 MED ORDER — POTASSIUM CHLORIDE CRYS ER 20 MEQ PO TBCR
40.0000 meq | EXTENDED_RELEASE_TABLET | Freq: Once | ORAL | Status: AC
  Administered 2018-12-28: 40 meq via ORAL
  Filled 2018-12-28: qty 2

## 2018-12-28 MED ORDER — DEXAMETHASONE SODIUM PHOSPHATE 10 MG/ML IJ SOLN
10.0000 mg | Freq: Once | INTRAMUSCULAR | Status: AC
  Administered 2018-12-28: 10 mg via INTRAVENOUS
  Filled 2018-12-28: qty 1

## 2018-12-28 MED ORDER — LEVOFLOXACIN IN D5W 750 MG/150ML IV SOLN: 750.0000 mg | INTRAVENOUS | Status: DC

## 2018-12-28 NOTE — ED Notes (Signed)
Sister, Vaunda Gutterman would like to be updated on pt condition [phone]

## 2018-12-28 NOTE — ED Triage Notes (Signed)
Per EMS, patient from home, Covid+ with SOB x2 days. Ambulatory. Denies N/V/D.  100% on 4L

## 2018-12-28 NOTE — ED Notes (Signed)
Patient c/o dizziness and weakness while ambulating in room. Oxygen saturation remained above 95% on room air.

## 2018-12-28 NOTE — ED Notes (Signed)
Sister, Yvette Hascall would like to be updated on pt condition [phone] 

## 2018-12-28 NOTE — ED Notes (Signed)
Daughter, Jaana Brodt would like update, [phone].

## 2018-12-28 NOTE — ED Notes (Signed)
Family contact: Miri Jose [phone] (daughter) Naylin Burkle [phone] (sister)

## 2018-12-28 NOTE — Progress Notes (Signed)
Patient-reported Beta-Lactam Allergy Assessment  Specific drug that caused reaction: Penicillin Reaction(s) that occurred: hives   Unable to interview pt as no answering phone in her Covid isolation room.  Levaquin 750 mg IV x 1 already give in ED per EDP order.  Likely could tolerate cephalosporins.   Further antibiotics/pharmacy consults should be ordered by admitting physician if indicated.                       Thank you,  Eudelia Bunch, Pharm.D [phone] 12/28/2018 5:29 PM

## 2018-12-28 NOTE — ED Notes (Signed)
ED Provider at bedside. 

## 2018-12-28 NOTE — ED Notes (Signed)
XR at bedside

## 2018-12-28 NOTE — ED Provider Notes (Signed)
[facility]-EMERGENCY DEPT Provider Note   CSN: [id number] Arrival date & time: 12/28/18  1358     History   Chief Complaint Chief Complaint  Patient presents with  . Covid+  . Shortness of Breath    HPI Cynthia Parsons is a 48 y.o. female.     HPI  48 year old female presents with worsening shortness of breath.  She was diagnosed with Covid-19.  Overall has had symptoms for about 6-7 days.  Fever, cough, shortness of breath.  Has been vomiting earlier but now has diarrhea.  No abdominal pain.  On and off chest pain that feels like pressure in the middle of her chest for the last 3 days.  Also having headaches.  She denies any leg swelling but the shortness of breath is certainly worse with laying flat.  Brought in by EMS who states her sats were around 88% and put her on 4 L. Is having to sleep up in the bed due to orthopnea.  Past Medical History:  Diagnosis Date  . Gout 09/28/2017    There are no active problems to display for this patient.   History reviewed. No pertinent surgical history.   OB History   No obstetric history on file.      Home Medications    Prior to Admission medications   Medication Sig Start Date End Date Taking? Authorizing Provider  ibuprofen (ADVIL,MOTRIN) 600 MG tablet Take 1 tablet (600 mg total) by mouth every 8 (eight) hours as needed. 09/29/17   Azalia Bilisampos, Kevin, MD  methocarbamol (ROBAXIN) 500 MG tablet Take 1 tablet (500 mg total) by mouth every 8 (eight) hours as needed for muscle spasms. 09/29/17   Azalia Bilisampos, Kevin, MD    Family History Family History  Problem Relation Age of Onset  . Hypertension Mother   . Diabetes Mother   . Stroke Mother   . Hypertension Father   . Diabetes Father     Social History Social History   Tobacco Use  . Smoking status: Never Smoker  . Smokeless tobacco: Never Used  Substance Use Topics  . Alcohol use: No  . Drug use: No     Allergies   Penicillins   Review of  Systems Review of Systems  Constitutional: Positive for fever.  Respiratory: Positive for cough and shortness of breath.   Cardiovascular: Positive for chest pain. Negative for leg swelling.  Gastrointestinal: Positive for diarrhea. Negative for abdominal pain.  Neurological: Positive for headaches.  All other systems reviewed and are negative.    Physical Exam Updated Vital Signs BP 126/65   Pulse (!) 114   Temp (!) 102.5 F (39.2 C) (Oral)   Resp 15   SpO2 100%   Physical Exam Vitals signs and nursing note reviewed.  Constitutional:      Appearance: She is well-developed. She is obese. She is not diaphoretic.  HENT:     Head: Normocephalic and atraumatic.     Right Ear: External ear normal.     Left Ear: External ear normal.     Nose: Nose normal.  Eyes:     General:        Right eye: No discharge.        Left eye: No discharge.  Cardiovascular:     Rate and Rhythm: Regular rhythm. Tachycardia present.     Heart sounds: Normal heart sounds.  Pulmonary:     Effort: Pulmonary effort is normal. No tachypnea or accessory muscle usage.  Breath sounds: Examination of the right-lower field reveals rales. Examination of the left-lower field reveals rales. Rales present.  Abdominal:     Palpations: Abdomen is soft.     Tenderness: There is no abdominal tenderness.  Musculoskeletal:     Right lower leg: No edema.     Left lower leg: No edema.  Skin:    General: Skin is warm and dry.  Neurological:     Mental Status: She is alert.  Psychiatric:        Mood and Affect: Mood is not anxious.      ED Treatments / Results  Labs (all labs ordered are listed, but only abnormal results are displayed) Labs Reviewed  CBC WITH DIFFERENTIAL/PLATELET - Abnormal; Notable for the following components:      Result Value   RBC 5.63 (*)    Hemoglobin 10.4 (*)    MCV 66.8 (*)    MCH 18.5 (*)    MCHC 27.7 (*)    RDW 17.7 (*)    All other components within normal limits   COMPREHENSIVE METABOLIC PANEL - Abnormal; Notable for the following components:   Potassium 3.4 (*)    Glucose, Bld 214 (*)    BUN 5 (*)    Calcium 8.5 (*)    Albumin 3.2 (*)    All other components within normal limits  LACTATE DEHYDROGENASE - Abnormal; Notable for the following components:   LDH 218 (*)    All other components within normal limits  C-REACTIVE PROTEIN - Abnormal; Notable for the following components:   CRP 7.0 (*)    All other components within normal limits  CULTURE, BLOOD (ROUTINE X 2)  CULTURE, BLOOD (ROUTINE X 2)  LACTIC ACID, PLASMA  FERRITIN  TRIGLYCERIDES  BRAIN NATRIURETIC PEPTIDE  PROCALCITONIN  D-DIMER, QUANTITATIVE (NOT AT [facility])  FIBRINOGEN  I-STAT BETA HCG BLOOD, ED ([facility], [facility], [facility] ONLY)  TROPONIN I (HIGH SENSITIVITY)    EKG EKG Interpretation  Date/Time:  Friday December 28 2018 14:25:49 EST Ventricular Rate:  117 PR Interval:    QRS Duration: 84 QT Interval:  314 QTC Calculation: 438 R Axis:   58 Text Interpretation: Sinus tachycardia LAE, consider biatrial enlargement Borderline T abnormalities, anterior leads rate is faster, T wave changes new since 2007 Confirmed by Pricilla Loveless [phone]) on 12/28/2018 2:35:10 PM   Radiology Dg Chest Port 1 View  Result Date: 12/28/2018 CLINICAL DATA:  COVID-19, dyspnea EXAM: PORTABLE CHEST 1 VIEW COMPARISON:  None. FINDINGS: Patchy interstitial and airspace opacities with basilar predominance, right greater than left. Slight blunting of the left costophrenic sulcus may reflect basilar consolidation or trace effusion. No right effusion or pneumothorax. The cardiomediastinal contours are unremarkable. No acute osseous or soft tissue abnormality. IMPRESSION: 1. Patchy interstitial and airspace opacities with basilar predominance, right greater than left, suspicious for multifocal pneumonia. 2. Slight blunting of the left costophrenic sulcus may reflect basilar consolidation or trace effusion. Electronically  Signed   By: Kreg Shropshire M.D.   On: 12/28/2018 16:02    Procedures Procedures (including critical care time)  Medications Ordered in ED Medications  sodium chloride 0.9 % bolus 1,000 mL (has no administration in time range)  levofloxacin (LEVAQUIN) IVPB 750 mg (has no administration in time range)  potassium chloride SA (KLOR-CON) CR tablet 40 mEq (has no administration in time range)  acetaminophen (TYLENOL) tablet 1,000 mg (1,000 mg Oral Given 12/28/18 1449)     Initial Impression / Assessment and Plan / ED Course  I  have reviewed the triage vital signs and the nursing notes.  Pertinent labs & imaging results that were available during my care of the patient were reviewed by me and considered in my medical decision making (see chart for details).        Patient presents with symptomatic Covid-19 infection.  She is tachycardic and tachypneic.  Per this is fever but she also probably is somewhat dehydrated.  Chest x-ray with significant findings, could be pneumonia on top of Covid given how long she has had her infection and now is worsening.  Also could just be worsening Covid-19 infection.  Either way, she felt like she was going to pass out with walking around and was so dizzy that I think it be reasonable to admit for obvious and continued supportive care.  Hospitalist to admit.  Cynthia Parsons was evaluated in Emergency Department on 12/28/2018 for the symptoms described in the history of present illness. She was evaluated in the context of the global COVID-19 pandemic, which necessitated consideration that the patient might be at risk for infection with the SARS-CoV-2 virus that causes COVID-19. Institutional protocols and algorithms that pertain to the evaluation of patients at risk for COVID-19 are in a state of rapid change based on information released by regulatory bodies including the CDC and federal and state organizations. These policies and algorithms were followed  during the patient's care in the ED.   Final Clinical Impressions(s) / ED Diagnoses   Final diagnoses:  COVID-19 virus infection    ED Discharge Orders    None       Sherwood Gambler, MD 12/28/18 [phone]

## 2018-12-28 NOTE — ED Notes (Signed)
Hospitalist at bedside 

## 2018-12-28 NOTE — H&P (Signed)
History and Physical    Cynthia Parsons ZOX:[id number] DOB: Aug 04, 1970 DOA: 12/28/2018  PCP: Patient, No Pcp Per   Patient coming from: Home    Chief Complaint: fever,cough ,chest pain,SoB  HPI: Cynthia Parsons is a 48 y.o. female with no significant past medical history who presented to the emergency department today with complaints of shortness of breath, cough, fever, chest pain, diarrhea. She has been sick since a week and is progressively getting worse.  She was tested positive for Covid-19 on 11/16.  She says she got Covid from her boyfriend who got Covid from his work.  Patient works at [employer].  She does not have any significant past medical problems.  She does not take any medicines at home. Patient reported of progressive symptoms of cough, shortness of breath and now she is unable to lie flat in the bed.  She was also having some central chest pain.  Complains of headache, generalized weakness and fatigue.  When EMS arrived to see her she was saturating 88% on room air and she had to be put on 4 L of oxygen per minute. Patient seen and examined the bedside this afternoon in the emergency department.  Currently she is hemodynamically stable. She denies smoking, regular alcohol use or substance abuse.  ED Course: Covid test found to be positive.  She was tachycardic, tachypneic on arrival.  Also found to be mildly dehydrated.  She was given a liter of normal saline.  Found to have elevated LDH, CRP.  Normal D-dimer, lactic acid, ferritin.  Found to be mildly hypokalemic.  Procalcitonin is pending.CXR showed Patchy interstitial and airspace opacities with basilar predominance, right greater than left, suspicious for multifocal pneumonia.  Patient started on Decadron and remdesivir.  Also given levofloxacin to cover for bacterial pneumonia.    Review of Systems: As per HPI otherwise 10 point review of systems negative.    Past Medical History:  Diagnosis Date  . Gout  09/28/2017    History reviewed. No pertinent surgical history.   reports that she has never smoked. She has never used smokeless tobacco. She reports that she does not drink alcohol or use drugs.  Allergies  Allergen Reactions  . Penicillins Hives    Family History  Problem Relation Age of Onset  . Hypertension Mother   . Diabetes Mother   . Stroke Mother   . Hypertension Father   . Diabetes Father      Prior to Admission medications   Medication Sig Start Date End Date Taking? Authorizing Provider  ibuprofen (ADVIL,MOTRIN) 600 MG tablet Take 1 tablet (600 mg total) by mouth every 8 (eight) hours as needed. 09/29/17   Azalia Bilis, MD  methocarbamol (ROBAXIN) 500 MG tablet Take 1 tablet (500 mg total) by mouth every 8 (eight) hours as needed for muscle spasms. 09/29/17   Azalia Bilis, MD    Physical Exam: Vitals:   12/28/18 1515 12/28/18 1600 12/28/18 1615 12/28/18 1630  BP: 126/63 126/65 127/63 114/76  Pulse: (!) 114 (!) 114 (!) 110 (!) 106  Resp: (!) 27 15 20 19   Temp:      TempSrc:      SpO2: 98% 100% 100% 99%    Constitutional: Generalized weakness, not in distress Vitals:   12/28/18 1515 12/28/18 1600 12/28/18 1615 12/28/18 1630  BP: 126/63 126/65 127/63 114/76  Pulse: (!) 114 (!) 114 (!) 110 (!) 106  Resp: (!) 27 15 20 19   Temp:      TempSrc:  SpO2: 98% 100% 100% 99%   ENMT: Mucous membranes are moist. Respiratory: Bilateral basal crackles.  Normal respiratory effort. No accessory muscle use.  Cardiovascular: Regular rate and rhythm, no murmurs / rubs / gallops. No extremity edema.  Abdomen: no tenderness Musculoskeletal: no clubbing / cyanosis. No joint deformity upper and lower extremities.  Skin: no rashes, lesions, ulcers. No induration Neurologic: Alert and oriented Foley Catheter:None  Labs on Admission: I have personally reviewed following labs and imaging studies  CBC: Recent Labs  Lab 12/28/18 1458  WBC 5.7  NEUTROABS 4.4  HGB 10.4*   HCT 37.6  MCV 66.8*  PLT 440   Basic Metabolic Panel: Recent Labs  Lab 12/28/18 1458  NA 136  K 3.4*  CL 102  CO2 25  GLUCOSE 214*  BUN 5*  CREATININE 0.70  CALCIUM 8.5*   GFR: CrCl cannot be calculated (Unknown ideal weight.). Liver Function Tests: Recent Labs  Lab 12/28/18 1458  AST 24  ALT 18  ALKPHOS 113  BILITOT 0.3  PROT 7.2  ALBUMIN 3.2*   No results for input(s): LIPASE, AMYLASE in the last 168 hours. No results for input(s): AMMONIA in the last 168 hours. Coagulation Profile: No results for input(s): INR, PROTIME in the last 168 hours. Cardiac Enzymes: No results for input(s): CKTOTAL, CKMB, CKMBINDEX, TROPONINI in the last 168 hours. BNP (last 3 results) No results for input(s): PROBNP in the last 8760 hours. HbA1C: No results for input(s): HGBA1C in the last 72 hours. CBG: No results for input(s): GLUCAP in the last 168 hours. Lipid Profile: Recent Labs    12/28/18 1458  TRIG 82   Thyroid Function Tests: No results for input(s): TSH, T4TOTAL, FREET4, T3FREE, THYROIDAB in the last 72 hours. Anemia Panel: Recent Labs    12/28/18 1458  FERRITIN 12   Urine analysis: No results found for: COLORURINE, APPEARANCEUR, LABSPEC, PHURINE, GLUCOSEU, HGBUR, BILIRUBINUR, KETONESUR, PROTEINUR, UROBILINOGEN, NITRITE, LEUKOCYTESUR  Radiological Exams on Admission: Dg Chest Port 1 View  Result Date: 12/28/2018 CLINICAL DATA:  COVID-19, dyspnea EXAM: PORTABLE CHEST 1 VIEW COMPARISON:  None. FINDINGS: Patchy interstitial and airspace opacities with basilar predominance, right greater than left. Slight blunting of the left costophrenic sulcus may reflect basilar consolidation or trace effusion. No right effusion or pneumothorax. The cardiomediastinal contours are unremarkable. No acute osseous or soft tissue abnormality. IMPRESSION: 1. Patchy interstitial and airspace opacities with basilar predominance, right greater than left, suspicious for multifocal  pneumonia. 2. Slight blunting of the left costophrenic sulcus may reflect basilar consolidation or trace effusion. Electronically Signed   By: Lovena Le M.D.   On: 12/28/2018 16:02     Assessment/Plan Active Problems:   COVID-19   Covid pneumonia: Presented with classic symptoms of Covid-19.  Chest x-ray showed multifocal pneumonia. Will start her on remdesivir and Decadron. Procalcitonin is pending.  Also started on Levaquin(allergic to penicillin) to cover for bacterial pneumonia.  Can DC Levaquin if procalcitonin is normal.  She was febrile on presentation.  Continue Tylenol for fever. Continue zinc, vitamin C Continue to monitor inflammatory markers.  Has elevated CRP and LDH Continue DVT prophylaxis with Lovenox 40 mg daily Continue supplemental oxygen as needed.  Keep dry as much as possible.  We will not continue IV fluids.  Microcytic anemia: We will check iron studies.  No active bleeding.  Hypokalemia: Supplemented with potassium.   Severity of Illness: The appropriate patient status for this patient is INPATIENT. Inpatient status is judged to be reasonable and necessary in  order to  DVT prophylaxis: Lovenox Code Status: Full Family Communication: None present at the bed side Consults called: None     Burnadette PopAmrit Jahvier Aldea MD [facility] Pager [phone]  If 7PM-7AM, please contact night-coverage [email or web address] Password [email or web address]  12/28/2018, 5:12 PM

## 2018-12-28 NOTE — ED Notes (Signed)
Patient given sandwich, cheese, and applesauce. Using bedside commode at this time. Reports she will call daughter.

## 2018-12-29 DIAGNOSIS — A09 Infectious gastroenteritis and colitis, unspecified: Secondary | ICD-10-CM

## 2018-12-29 DIAGNOSIS — D509 Iron deficiency anemia, unspecified: Secondary | ICD-10-CM | POA: Diagnosis present

## 2018-12-29 DIAGNOSIS — J9600 Acute respiratory failure, unspecified whether with hypoxia or hypercapnia: Secondary | ICD-10-CM

## 2018-12-29 DIAGNOSIS — R197 Diarrhea, unspecified: Secondary | ICD-10-CM | POA: Diagnosis present

## 2018-12-29 LAB — COMPREHENSIVE METABOLIC PANEL
ALT: 17 U/L (ref 0–44)
AST: 22 U/L (ref 15–41)
Albumin: 3.1 g/dL — ABNORMAL LOW (ref 3.5–5.0)
Alkaline Phosphatase: 103 U/L (ref 38–126)
Anion gap: 10 (ref 5–15)
BUN: 8 mg/dL (ref 6–20)
CO2: 22 mmol/L (ref 22–32)
Calcium: 8.9 mg/dL (ref 8.9–10.3)
Chloride: 105 mmol/L (ref 98–111)
Creatinine, Ser: 0.65 mg/dL (ref 0.44–1.00)
GFR calc Af Amer: >60 mL/min (ref >60)
GFR calc non Af Amer: >60 mL/min (ref >60)
Glucose, Bld: 296 mg/dL — ABNORMAL HIGH (ref 70–99)
Potassium: 4.7 mmol/L (ref 3.5–5.1)
Sodium: 137 mmol/L (ref 135–145)
Total Bilirubin: 0.4 mg/dL (ref 0.3–1.2)
Total Protein: 7.2 g/dL (ref 6.5–8.1)

## 2018-12-29 LAB — CBC WITH DIFFERENTIAL/PLATELET
Abs Immature Granulocytes: 0.03 10*3/uL (ref 0.00–0.07)
Basophils Absolute: 0 10*3/uL (ref 0.0–0.1)
Basophils Relative: 0 %
Eosinophils Absolute: 0 10*3/uL (ref 0.0–0.5)
Eosinophils Relative: 0 %
HCT: 35.8 % — ABNORMAL LOW (ref 36.0–46.0)
Hemoglobin: 9.8 g/dL — ABNORMAL LOW (ref 12.0–15.0)
Immature Granulocytes: 1 %
Lymphocytes Relative: 23 %
Lymphs Abs: 0.9 10*3/uL (ref 0.7–4.0)
MCH: 18.4 pg — ABNORMAL LOW (ref 26.0–34.0)
MCHC: 27.4 g/dL — ABNORMAL LOW (ref 30.0–36.0)
MCV: 67 fL — ABNORMAL LOW (ref 80.0–100.0)
Monocytes Absolute: 0.1 10*3/uL (ref 0.1–1.0)
Monocytes Relative: 3 %
Neutro Abs: 2.9 10*3/uL (ref 1.7–7.7)
Neutrophils Relative %: 73 %
Platelets: 360 10*3/uL (ref 150–400)
RBC: 5.34 MIL/uL — ABNORMAL HIGH (ref 3.87–5.11)
RDW: 17.8 % — ABNORMAL HIGH (ref 11.5–15.5)
WBC: 3.9 10*3/uL — ABNORMAL LOW (ref 4.0–10.5)
nRBC: 0 % (ref 0.0–0.2)

## 2018-12-29 LAB — LACTATE DEHYDROGENASE: LDH: 233 U/L — ABNORMAL HIGH (ref 98–192)

## 2018-12-29 LAB — PROCALCITONIN: Procalcitonin: <0.1 ng/mL

## 2018-12-29 LAB — MAGNESIUM: Magnesium: 1.8 mg/dL (ref 1.7–2.4)

## 2018-12-29 LAB — D-DIMER, QUANTITATIVE: D-Dimer, Quant: 0.5 ug/mL-FEU (ref 0.00–0.50)

## 2018-12-29 LAB — C-REACTIVE PROTEIN: CRP: 9.1 mg/dL — ABNORMAL HIGH (ref <1.0)

## 2018-12-29 LAB — FERRITIN: Ferritin: 15 ng/mL (ref 11–307)

## 2018-12-29 MED ORDER — HYDROCOD POLST-CPM POLST ER 10-8 MG/5ML PO SUER
5.0000 mL | Freq: Two times a day (BID) | ORAL | Status: DC
  Administered 2018-12-29 – 2019-01-01 (×7): 5 mL via ORAL
  Filled 2018-12-29 (×7): qty 5

## 2018-12-29 MED ORDER — TOCILIZUMAB 400 MG/20ML IV SOLN
800.0000 mg | Freq: Once | INTRAVENOUS | Status: AC
  Administered 2018-12-29: 800 mg via INTRAVENOUS
  Filled 2018-12-29: qty 40

## 2018-12-29 MED ORDER — SODIUM CHLORIDE 0.9 % IV SOLN
INTRAVENOUS | Status: DC | PRN
  Administered 2018-12-29: 250 mL via INTRAVENOUS

## 2018-12-29 MED ORDER — BENZONATATE 100 MG PO CAPS
100.0000 mg | ORAL_CAPSULE | Freq: Three times a day (TID) | ORAL | Status: DC
  Administered 2018-12-29 – 2019-01-01 (×10): 100 mg via ORAL
  Filled 2018-12-29 (×9): qty 1

## 2018-12-29 MED ORDER — LOPERAMIDE HCL 2 MG PO CAPS
2.0000 mg | ORAL_CAPSULE | Freq: Four times a day (QID) | ORAL | Status: DC | PRN
  Administered 2018-12-29 – 2018-12-31 (×3): 2 mg via ORAL
  Filled 2018-12-29 (×3): qty 1

## 2018-12-29 MED ORDER — HYDROCOD POLST-CPM POLST ER 10-8 MG/5ML PO SUER
5.0000 mL | Freq: Two times a day (BID) | ORAL | Status: DC | PRN
  Administered 2018-12-29: 5 mL via ORAL
  Filled 2018-12-29: qty 5

## 2018-12-29 NOTE — Plan of Care (Signed)

## 2018-12-29 NOTE — Progress Notes (Addendum)
[facility]                                                                              Patient Demographics  Cynthia Parsons, is a 48 y.o. female, DOB - [birth year], ZOX:[id number]  Admit date - 12/28/2018   Admitting Physician Burnadette Pop, MD  Outpatient Primary MD for the patient is Patient, No Pcp Per  Outpatient specialists:   LOS - 1  days   Medical records reviewed and are as summarized below:    Chief Complaint  Patient presents with  . Covid  . Shortness of Breath       Brief summary   Patient is a 48 year old female with no significant past medical history presented to ED with shortness of breath, coughing, fevers, chest tightness, diarrhea.  Patient reported that she tested positive for Covid on 11/16.    Assessment & Plan     1. Acute Hypoxic Resp. Failure due to Acute Covid 19 Viral Pneumonitis during the ongoing 2020 Covid 19 Pandemic - POA - Patient hypoxic, at the tiime of exam, sats 90% on room air, placed on O2, recommended proning   - Patient started on IV dexamethasone, remdesivir. CRP trending up, will give one dose of Actemra. Discussed pro/cons/side effects of actemra with the patient, she is in full agreement of receiving actemra.  - Continue to wean oxygen, ambulatory O2 screening daily as tolerated - DC levaquin, procalcitonin <0.1 - Continue inhalers, cough suppressants, vitamins, supportive treatment, flutter valve  - Continue to follow labs as below  Recent Labs  Lab 12/28/18 1458 12/28/18 1530 12/29/18 0409  DDIMER  --  0.49 0.50  FERRITIN 12  --  15  CRP 7.0*  --  9.1*  ALT 18  --  17  PROCALCITON <0.10  --  <0.10    Microcytic anemia:  - anemia panel consistent with Fe deficiency, recommend outpatient w/u by PCP.   Obesity BMI 37.79, counseled on diet and weight control    Diarrhea: likely due to COVID 19 viral gastroenteritis - will add imodium prn.  - encouraged cont PO diet  Code Status: full  code  DVT Prophylaxis:  Lovenox Family Communication: Discussed all imaging results, lab results, explained to the patient.  Called patient's daughter but she did not pick up the phone.   Disposition Plan: Pending clinical status, currently inpatient given acute hypoxic respiratory failure, acute COVID-19 illness.  Once ambulatory without overt symptoms or hypoxia would consider discharge home.  Time Spent in minutes   Procedures:  none  Consultants:   None   Antimicrobials:   Anti-infectives (From admission, onward)   Start     Dose/Rate Route Frequency Ordered Stop   12/30/18 1000  remdesivir 100 mg in sodium chloride 0.9 % 250 mL IVPB     100 mg 500 mL/hr over 30 Minutes Intravenous Every 24 hours 12/28/18 1706 01/02/19 0959   12/29/18 1700  levofloxacin (LEVAQUIN) IVPB 750 mg  Status:  Discontinued     750 mg 100 mL/hr over 90 Minutes Intravenous Every 24 hours 12/28/18 1704 12/29/18 0900   12/29/18 1600  remdesivir 100 mg in  sodium chloride 0.9 % 250 mL IVPB     100 mg 500 mL/hr over 30 Minutes Intravenous Every 24 hours 12/28/18 1706 12/30/18 1559   12/28/18 1800  remdesivir 200 mg in sodium chloride 0.9 % 250 mL IVPB     200 mg 500 mL/hr over 30 Minutes Intravenous Once 12/28/18 1706 12/28/18 1900   12/28/18 1615  levofloxacin (LEVAQUIN) IVPB 750 mg     750 mg 100 mL/hr over 90 Minutes Intravenous  Once 12/28/18 1609 12/28/18 1905         Medications  Scheduled Meds: . benzonatate  100 mg Oral TID  . chlorpheniramine-HYDROcodone  5 mL Oral Q12H  . dexamethasone (DECADRON) injection  6 mg Intravenous Daily  . enoxaparin (LOVENOX) injection  55 mg Subcutaneous Q24H  . vitamin C  500 mg Oral BID  . zinc sulfate  220 mg Oral Daily   Continuous Infusions: . sodium chloride 250 mL (12/29/18 1200)  . remdesivir 100 mg in NS 250 mL     Followed by  . [START ON 12/30/2018] remdesivir 100 mg in NS 250 mL     PRN Meds:.sodium chloride, acetaminophen,  loperamide, ondansetron (ZOFRAN) IV      Subjective:   Cynthia Parsons was seen and examined today.  At the time of my exam, sats 90% on RA, no chest pain.  Diarrhea +,  Patient denies dizziness, abdominal pain, new weakness, numbess, tingling. + nausea. No fevers   Objective:   Vitals:   12/28/18 2100 12/29/18 0530 12/29/18 0929 12/29/18 1350  BP:  126/72  (!) 151/92  Pulse:  82  90  Resp: 18 20  (!) 21  Temp: 98.7 F (37.1 C) 97.7 F (36.5 C)  97.9 F (36.6 C)  TempSrc: Oral Oral  Oral  SpO2: 93% 96% 97% 95%  Weight:      Height:        Intake/Output Summary (Last 24 hours) at 12/29/2018 1406 Last data filed at 12/29/2018 1352 Gross per 24 hour  Intake 840 ml  Output -  Net 840 ml     Wt Readings from Last 3 Encounters:  12/28/18 106.1 kg  09/29/17 106.1 kg  05/19/16 99.8 kg     Exam  General: Alert and oriented x 3, NAD  Eyes:   HEENT:  Atraumatic, normocephalic,  Cardiovascular: S1 S2 auscultated, RRR  Respiratory: dec BS at bases   Gastrointestinal: Soft, nontender, nondistended, + bowel sounds  Ext: no pedal edema bilaterally  Neuro: no new deficits   Musculoskeletal: No digital cyanosis, clubbing  Skin: No rashes  Psych: Normal affect and demeanor, alert and oriented x3    Data Reviewed:  I have personally reviewed following labs and imaging studies  Micro Results Recent Results (from the past 240 hour(s))  Novel Coronavirus, NAA ([facility])     Status: Abnormal   Collection Time: 12/24/18  3:13 PM   Specimen: Nasopharyngeal(NP) swabs in vial transport medium   NASOPHARYNGE  TESTING  Result Value Ref Range Status   SARS-CoV-2, NAA Detected (A) Not Detected Final    Comment: This nucleic acid amplification test was developed and its performance characteristics determined by [facility]. Nucleic acid amplification tests include PCR and TMA. This test has not been FDA cleared or approved. This test has been authorized by  FDA under an Emergency Use Authorization (EUA). This test is only authorized for the duration of time the declaration that circumstances exist justifying the authorization of the emergency use of in vitro  diagnostic tests for detection of SARS-CoV-2 virus and/or diagnosis of COVID-19 infection under section 564(b)(1) of the Act, 21 U.S.C. 109NAT-5(T) (1), unless the authorization is terminated or revoked sooner. When diagnostic testing is negative, the possibility of a false negative result should be considered in the context of a patient's recent exposures and the presence of clinical signs and symptoms consistent with COVID-19. An individual without symptoms of COVID-19 and who is not shedding SARS-CoV-2 virus would  expect to have a negative (not detected) result in this assay.   Blood Culture (routine x 2)     Status: None (Preliminary result)   Collection Time: 12/28/18  2:58 PM   Specimen: BLOOD  Result Value Ref Range Status   Specimen Description   Final    BLOOD RIGHT ANTECUBITAL Performed at [facility], [place] [address], [place], [place] [address]    Special Requests   Final    BOTTLES DRAWN AEROBIC AND ANAEROBIC Blood Culture results may not be optimal due to an inadequate volume of blood received in culture bottles Performed at [facility], [address] [place], [place] [address]    Culture   Final    NO GROWTH < 24 HOURS Performed at [place] [address], [place], [place] [address]    Report Status PENDING  Incomplete  Blood Culture (routine x 2)     Status: None (Preliminary result)   Collection Time: 12/28/18  3:30 PM   Specimen: BLOOD LEFT HAND  Result Value Ref Range Status   Specimen Description   Final    BLOOD LEFT HAND Performed at [facility], [place] [address] [place], [place] [address]    Special Requests   Final    BOTTLES DRAWN AEROBIC AND ANAEROBIC Blood  Culture results may not be optimal due to an inadequate volume of blood received in culture bottles Performed at [facility], [address] [place], [place] [address]    Culture   Final    NO GROWTH < 24 HOURS Performed at [facility] [address], [place], [place] [address]    Report Status PENDING  Incomplete    Radiology Reports Dg Chest Port 1 View  Result Date: 12/28/2018 CLINICAL DATA:  COVID-19, dyspnea EXAM: PORTABLE CHEST 1 VIEW COMPARISON:  None. FINDINGS: Patchy interstitial and airspace opacities with basilar predominance, right greater than left. Slight blunting of the left costophrenic sulcus may reflect basilar consolidation or trace effusion. No right effusion or pneumothorax. The cardiomediastinal contours are unremarkable. No acute osseous or soft tissue abnormality. IMPRESSION: 1. Patchy interstitial and airspace opacities with basilar predominance, right greater than left, suspicious for multifocal pneumonia. 2. Slight blunting of the left costophrenic sulcus may reflect basilar consolidation or trace effusion. Electronically Signed   By: Lovena Le M.D.   On: 12/28/2018 16:02    Lab Data:  CBC: Recent Labs  Lab 12/28/18 1458 12/29/18 0409  WBC 5.7 3.9*  NEUTROABS 4.4 2.9  HGB 10.4* 9.8*  HCT 37.6 35.8*  MCV 66.8* 67.0*  PLT 316 106   Basic Metabolic Panel: Recent Labs  Lab 12/28/18 1458 12/29/18 0409  NA 136 137  K 3.4* 4.7  CL 102 105  CO2 25 22  GLUCOSE 214* 296*  BUN 5* 8  CREATININE 0.70 0.65  CALCIUM 8.5* 8.9  MG  --  1.8   GFR: Estimated Creatinine Clearance: 103.6 mL/min (by C-G formula based  on SCr of 0.65 mg/dL). Liver Function Tests: Recent Labs  Lab 12/28/18 1458 12/29/18 0409  AST 24 22  ALT 18 17  ALKPHOS 113 103  BILITOT 0.3 0.4  PROT 7.2 7.2  ALBUMIN 3.2* 3.1*   No results for input(s): LIPASE, AMYLASE in the last 168 hours. No results for input(s): AMMONIA in the last 168  hours. Coagulation Profile: No results for input(s): INR, PROTIME in the last 168 hours. Cardiac Enzymes: No results for input(s): CKTOTAL, CKMB, CKMBINDEX, TROPONINI in the last 168 hours. BNP (last 3 results) No results for input(s): PROBNP in the last 8760 hours. HbA1C: No results for input(s): HGBA1C in the last 72 hours. CBG: No results for input(s): GLUCAP in the last 168 hours. Lipid Profile: Recent Labs    12/28/18 1458  TRIG 82   Thyroid Function Tests: No results for input(s): TSH, T4TOTAL, FREET4, T3FREE, THYROIDAB in the last 72 hours. Anemia Panel: Recent Labs    12/28/18 1458 12/29/18 0409  FERRITIN 12 15  TIBC 391  --   IRON 17*  --    Urine analysis: No results found for: COLORURINE, APPEARANCEUR, LABSPEC, PHURINE, GLUCOSEU, HGBUR, BILIRUBINUR, KETONESUR, PROTEINUR, UROBILINOGEN, NITRITE, LEUKOCYTESUR   Aryanna Shaver M.D. [facility] 12/29/2018, 2:06 PM   Call night coverage person covering after 7pm

## 2018-12-30 LAB — PROCALCITONIN: Procalcitonin: <0.1 ng/mL

## 2018-12-30 LAB — CBC WITH DIFFERENTIAL/PLATELET
Abs Immature Granulocytes: 0.03 10*3/uL (ref 0.00–0.07)
Basophils Absolute: 0 10*3/uL (ref 0.0–0.1)
Basophils Relative: 0 %
Eosinophils Absolute: 0 10*3/uL (ref 0.0–0.5)
Eosinophils Relative: 0 %
HCT: 33.7 % — ABNORMAL LOW (ref 36.0–46.0)
Hemoglobin: 9.2 g/dL — ABNORMAL LOW (ref 12.0–15.0)
Immature Granulocytes: 1 %
Lymphocytes Relative: 35 %
Lymphs Abs: 1.4 10*3/uL (ref 0.7–4.0)
MCH: 18.3 pg — ABNORMAL LOW (ref 26.0–34.0)
MCHC: 27.3 g/dL — ABNORMAL LOW (ref 30.0–36.0)
MCV: 66.9 fL — ABNORMAL LOW (ref 80.0–100.0)
Monocytes Absolute: 0.4 10*3/uL (ref 0.1–1.0)
Monocytes Relative: 11 %
Neutro Abs: 2.1 10*3/uL (ref 1.7–7.7)
Neutrophils Relative %: 53 %
Platelets: 357 10*3/uL (ref 150–400)
RBC: 5.04 MIL/uL (ref 3.87–5.11)
RDW: 17.9 % — ABNORMAL HIGH (ref 11.5–15.5)
WBC: 3.9 10*3/uL — ABNORMAL LOW (ref 4.0–10.5)
nRBC: 0 % (ref 0.0–0.2)

## 2018-12-30 LAB — COMPREHENSIVE METABOLIC PANEL
ALT: 16 U/L (ref 0–44)
AST: 32 U/L (ref 15–41)
Albumin: 3 g/dL — ABNORMAL LOW (ref 3.5–5.0)
Alkaline Phosphatase: 102 U/L (ref 38–126)
Anion gap: 15 (ref 5–15)
BUN: 12 mg/dL (ref 6–20)
CO2: 19 mmol/L — ABNORMAL LOW (ref 22–32)
Calcium: 9.2 mg/dL (ref 8.9–10.3)
Chloride: 102 mmol/L (ref 98–111)
Creatinine, Ser: 0.62 mg/dL (ref 0.44–1.00)
GFR calc Af Amer: >60 mL/min (ref >60)
GFR calc non Af Amer: >60 mL/min (ref >60)
Glucose, Bld: 329 mg/dL — ABNORMAL HIGH (ref 70–99)
Potassium: 5.1 mmol/L (ref 3.5–5.1)
Sodium: 136 mmol/L (ref 135–145)
Total Bilirubin: 0.4 mg/dL (ref 0.3–1.2)
Total Protein: 6.8 g/dL (ref 6.5–8.1)

## 2018-12-30 LAB — C-REACTIVE PROTEIN: CRP: 6 mg/dL — ABNORMAL HIGH (ref <1.0)

## 2018-12-30 LAB — MAGNESIUM: Magnesium: 1.9 mg/dL (ref 1.7–2.4)

## 2018-12-30 LAB — LACTATE DEHYDROGENASE: LDH: 316 U/L — ABNORMAL HIGH (ref 98–192)

## 2018-12-30 LAB — FERRITIN: Ferritin: 18 ng/mL (ref 11–307)

## 2018-12-30 LAB — D-DIMER, QUANTITATIVE: D-Dimer, Quant: 0.38 ug/mL-FEU (ref 0.00–0.50)

## 2018-12-30 MED ORDER — ENSURE ENLIVE PO LIQD
237.0000 mL | Freq: Two times a day (BID) | ORAL | Status: DC
  Administered 2018-12-30 – 2019-01-01 (×3): 237 mL via ORAL

## 2018-12-30 MED ORDER — ADULT MULTIVITAMIN W/MINERALS CH
1.0000 | ORAL_TABLET | Freq: Every day | ORAL | Status: DC
  Administered 2018-12-30 – 2019-01-01 (×3): 1 via ORAL
  Filled 2018-12-30 (×3): qty 1

## 2018-12-30 NOTE — Plan of Care (Signed)

## 2018-12-30 NOTE — Progress Notes (Signed)
[facility]                                                                              Patient Demographics  Cynthia Parsons, is a 48 y.o. female, DOB - 22-Apr-1970, UJW:[id number]  Admit date - 12/28/2018   Admitting Physician Burnadette Pop, MD  Outpatient Primary MD for the patient is Patient, No Pcp Per  Outpatient specialists:   LOS - 2  days   Medical records reviewed and are as summarized below:    Chief Complaint  Patient presents with  . Covid  . Shortness of Breath       Brief summary   Patient is a 48 year old female with no significant past medical history presented to ED with shortness of breath, coughing, fevers, chest tightness, diarrhea.  Patient reported that she tested positive for Covid on 11/16.    Assessment & Plan     1. Acute Hypoxic Resp. Failure due to Acute Covid 19 Viral Pneumonitis during the ongoing 2020 Covid 19 Pandemic - POA - Much improving today, states shortness of breath is better, no chest pain.  Did proning with incentive spirometry, today on room air.  Afebrile. - Continue IV dexamethasone, remdesivir, day #3 today.  Received 1 dose of Actemra on 11/21.  - Continue to wean oxygen, ambulatory O2 screening daily as tolerated - Levaquin was discontinued., procalcitonin <0.1 - Continue inhalers, cough suppressants, vitamins, supportive treatment, flutter valve  - Continue to follow labs as below  Recent Labs  Lab 12/28/18 1458 12/28/18 1530 12/29/18 0409 12/30/18 0246  DDIMER  --  0.49 0.50 0.38  FERRITIN 12  --  15 18  CRP 7.0*  --  9.1* 6.0*  ALT 18  --  17  --   PROCALCITON <0.10  --  <0.10 <0.10    Microcytic anemia:  - anemia panel consistent with Fe deficiency, recommend outpatient work-up by PCP  Obesity BMI 37.79, counseled on diet and weight control    Diarrhea: likely due to COVID 19 viral gastroenteritis - will add imodium prn.  - encouraged cont PO diet  Code Status: full code  DVT  Prophylaxis:  Lovenox Family Communication: Discussed all imaging results, lab results, explained to the patient's daughter on phone.     Disposition Plan: Pending clinical status, currently inpatient given acute hypoxic respiratory failure, acute COVID-19 illness.  Once ambulatory without overt symptoms or hypoxia, would consider discharge home.  Time Spent in minutes   Procedures:  none  Consultants:   None   Antimicrobials:   Anti-infectives (From admission, onward)   Start     Dose/Rate Route Frequency Ordered Stop   12/30/18 1000  remdesivir 100 mg in sodium chloride 0.9 % 250 mL IVPB     100 mg 500 mL/hr over 30 Minutes Intravenous Every 24 hours 12/28/18 1706 01/02/19 0959   12/29/18 1700  levofloxacin (LEVAQUIN) IVPB 750 mg  Status:  Discontinued     750 mg 100 mL/hr over 90 Minutes Intravenous Every 24 hours 12/28/18 1704 12/29/18 0900   12/29/18 1600  remdesivir 100 mg in sodium chloride 0.9 % 250 mL IVPB  100 mg 500 mL/hr over 30 Minutes Intravenous Every 24 hours 12/28/18 1706 12/29/18 2054   12/28/18 1800  remdesivir 200 mg in sodium chloride 0.9 % 250 mL IVPB     200 mg 500 mL/hr over 30 Minutes Intravenous Once 12/28/18 1706 12/28/18 1900   12/28/18 1615  levofloxacin (LEVAQUIN) IVPB 750 mg     750 mg 100 mL/hr over 90 Minutes Intravenous  Once 12/28/18 1609 12/28/18 1905         Medications  Scheduled Meds: . benzonatate  100 mg Oral TID  . chlorpheniramine-HYDROcodone  5 mL Oral Q12H  . dexamethasone (DECADRON) injection  6 mg Intravenous Daily  . enoxaparin (LOVENOX) injection  55 mg Subcutaneous Q24H  . vitamin C  500 mg Oral BID  . zinc sulfate  220 mg Oral Daily   Continuous Infusions: . sodium chloride 10 mL/hr at 12/30/18 1109  . remdesivir 100 mg in NS 250 mL 100 mg (12/30/18 1112)   PRN Meds:.sodium chloride, acetaminophen, loperamide, ondansetron (ZOFRAN) IV      Subjective:   Cynthia Parsons was seen and examined  today.  No significant shortness of breath today, states she did proning and  incentive spirometry yesterday.  Today feels a lot better.  No chest pain.  Diarrhea improving.  patient denies dizziness, abdominal pain, new weakness, numbess, tingling. + nausea.  Afebrile Objective:   Vitals:   12/29/18 1350 12/29/18 2109 12/30/18 0617 12/30/18 1333  BP: (!) 151/92 (!) 143/82 113/70 126/77  Pulse: 90 81 (!) 58 72  Resp: (!) 21 (!) 24 16 20   Temp: 97.9 F (36.6 C) 97.9 F (36.6 C) 97.7 F (36.5 C) 98.2 F (36.8 C)  TempSrc: Oral Oral Oral Oral  SpO2: 95% 98% 94% 98%  Weight:      Height:        Intake/Output Summary (Last 24 hours) at 12/30/2018 1420 Last data filed at 12/30/2018 1332 Gross per 24 hour  Intake 634.83 ml  Output 2 ml  Net 632.83 ml     Wt Readings from Last 3 Encounters:  12/28/18 106.1 kg  09/29/17 106.1 kg  05/19/16 99.8 kg    Physical Exam  General: Alert and oriented x 3, NAD  Eyes:   HEENT:  Atraumatic, normocephalic  Cardiovascular: S1 S2 clear, no murmurs, RRR. No pedal edema b/l  Respiratory: Diminished breath sound at the bases otherwise no wheezing  Gastrointestinal: Soft, nontender, nondistended, NBS  Ext: no pedal edema bilaterally  Neuro: no new deficits  Musculoskeletal: No cyanosis, clubbing  Skin: No rashes  Psych: Normal affect and demeanor, alert and oriented x3      Data Reviewed:  I have personally reviewed following labs and imaging studies  Micro Results Recent Results (from the past 240 hour(s))  Novel Coronavirus, NAA ([facility])     Status: Abnormal   Collection Time: 12/24/18  3:13 PM   Specimen: Nasopharyngeal(NP) swabs in vial transport medium   NASOPHARYNGE  TESTING  Result Value Ref Range Status   SARS-CoV-2, NAA Detected (A) Not Detected Final    Comment: This nucleic acid amplification test was developed and its performance characteristics determined by [employer]. Nucleic acid amplification  tests include PCR and TMA. This test has not been FDA cleared or approved. This test has been authorized by FDA under an Emergency Use Authorization (EUA). This test is only authorized for the duration of time the declaration that circumstances exist justifying the authorization of the emergency use of in vitro diagnostic  tests for detection of SARS-CoV-2 virus and/or diagnosis of COVID-19 infection under section 564(b)(1) of the Act, 21 U.S.C. 672CNO-7(S) (1), unless the authorization is terminated or revoked sooner. When diagnostic testing is negative, the possibility of a false negative result should be considered in the context of a patient's recent exposures and the presence of clinical signs and symptoms consistent with COVID-19. An individual without symptoms of COVID-19 and who is not shedding SARS-CoV-2 virus would  expect to have a negative (not detected) result in this assay.   Blood Culture (routine x 2)     Status: None (Preliminary result)   Collection Time: 12/28/18  2:58 PM   Specimen: BLOOD  Result Value Ref Range Status   Specimen Description   Final    BLOOD RIGHT ANTECUBITAL Performed at [facility], [address] [place], [place] [address]    Special Requests   Final    BOTTLES DRAWN AEROBIC AND ANAEROBIC Blood Culture results may not be optimal due to an inadequate volume of blood received in culture bottles Performed at [facility], [address], [place], [place] [address]    Culture   Final    NO GROWTH 2 DAYS Performed at [facility], [address] [place], [place] [address]    Report Status PENDING  Incomplete  Blood Culture (routine x 2)     Status: None (Preliminary result)   Collection Time: 12/28/18  3:30 PM   Specimen: BLOOD LEFT HAND  Result Value Ref Range Status   Specimen Description   Final    BLOOD LEFT HAND Performed at [facility], [address], [place], [place] [address]    Special Requests   Final    BOTTLES DRAWN AEROBIC AND ANAEROBIC Blood Culture results may not be optimal due to an inadequate volume of blood received in culture bottles Performed at [facility], [address], [place], [place] [address]    Culture   Final    NO GROWTH 2 DAYS Performed at [facility], [address] [place], [place] [address]    Report Status PENDING  Incomplete    Radiology Reports Dg Chest Port 1 View  Result Date: 12/28/2018 CLINICAL DATA:  COVID-19, dyspnea EXAM: PORTABLE CHEST 1 VIEW COMPARISON:  None. FINDINGS: Patchy interstitial and airspace opacities with basilar predominance, right greater than left. Slight blunting of the left costophrenic sulcus may reflect basilar consolidation or trace effusion. No right effusion or pneumothorax. The cardiomediastinal contours are unremarkable. No acute osseous or soft tissue abnormality. IMPRESSION: 1. Patchy interstitial and airspace opacities with basilar predominance, right greater than left, suspicious for multifocal pneumonia. 2. Slight blunting of the left costophrenic sulcus may reflect basilar consolidation or trace effusion. Electronically Signed   By: Kreg Shropshire M.D.   On: 12/28/2018 16:02    Lab Data:  CBC: Recent Labs  Lab 12/28/18 1458 12/29/18 0409 12/30/18 0246  WBC 5.7 3.9* 3.9*  NEUTROABS 4.4 2.9 2.1  HGB 10.4* 9.8* 9.2*  HCT 37.6 35.8* 33.7*  MCV 66.8* 67.0* 66.9*  PLT 316 360 357   Basic Metabolic Panel: Recent Labs  Lab 12/28/18 1458 12/29/18 0409  NA 136 137  K 3.4* 4.7  CL 102 105  CO2 25 22  GLUCOSE 214* 296*  BUN 5* 8  CREATININE 0.70 0.65  CALCIUM 8.5* 8.9  MG  --  1.8   GFR: Estimated Creatinine Clearance: 103.6  mL/min (by C-G formula based on SCr of 0.65 mg/dL). Liver Function Tests: Recent Labs  Lab 12/28/18 1458 12/29/18 0409  AST 24 22  ALT 18 17  ALKPHOS 113 103  BILITOT 0.3 0.4  PROT 7.2 7.2   ALBUMIN 3.2* 3.1*   No results for input(s): LIPASE, AMYLASE in the last 168 hours. No results for input(s): AMMONIA in the last 168 hours. Coagulation Profile: No results for input(s): INR, PROTIME in the last 168 hours. Cardiac Enzymes: No results for input(s): CKTOTAL, CKMB, CKMBINDEX, TROPONINI in the last 168 hours. BNP (last 3 results) No results for input(s): PROBNP in the last 8760 hours. HbA1C: No results for input(s): HGBA1C in the last 72 hours. CBG: No results for input(s): GLUCAP in the last 168 hours. Lipid Profile: Recent Labs    12/28/18 1458  TRIG 82   Thyroid Function Tests: No results for input(s): TSH, T4TOTAL, FREET4, T3FREE, THYROIDAB in the last 72 hours. Anemia Panel: Recent Labs    12/28/18 1458 12/29/18 0409 12/30/18 0246  FERRITIN 12 15 18   TIBC 391  --   --   IRON 17*  --   --    Urine analysis: No results found for: COLORURINE, APPEARANCEUR, LABSPEC, PHURINE, GLUCOSEU, HGBUR, BILIRUBINUR, KETONESUR, PROTEINUR, UROBILINOGEN, NITRITE, LEUKOCYTESUR     M.D. [facility] 12/30/2018, 2:20 PM   Call night coverage person covering after 7pm

## 2018-12-30 NOTE — Progress Notes (Signed)
Initial Nutrition Assessment  DOCUMENTATION CODES:   Obesity unspecified  INTERVENTION:   -Ensure Enlive po BID, each supplement provides 350 kcal and 20 grams of protein -Multivitamin with minerals daily  NUTRITION DIAGNOSIS:   Increased nutrient needs related to acute illness as evidenced by estimated needs.  GOAL:   Patient will meet greater than or equal to 90% of their needs  MONITOR:   PO intake, Supplement acceptance, Labs, Weight trends, I & O's  REASON FOR ASSESSMENT:   Malnutrition Screening Tool    ASSESSMENT:   48 year old female with no significant past medical history presented to ED with shortness of breath, coughing, fevers, chest tightness, diarrhea.  Patient reported that she tested positive for Covid on 11/16.  **RD working remotely**  Patient reporting SOB for ~1 week PTA. Pt with diarrhea but no N/V.  Patient is consuming 50-100% of meals since admission. Diarrhea continues today. Pt would benefit from protein supplements given active COVID-19 infection, will order Ensure supplements. Noted that a transfer order has been placed for patient to move to [facility].  Per weight records, no weight loss noted.   I/Os: +1.4L since admit  Labs reviewed. Medications: Vitamin C tablet, Zinc sulfate capsule    NUTRITION - FOCUSED PHYSICAL EXAM:  Working remotely.  Diet Order:   Diet Order            Diet regular Room service appropriate? Yes; Fluid consistency: Thin  Diet effective now              EDUCATION NEEDS:   No education needs have been identified at this time  Skin:  Skin Assessment: Reviewed RN Assessment  Last BM:  11/22  Height:   Ht Readings from Last 1 Encounters:  12/28/18 5\' 6"  (1.676 m)    Weight:   Wt Readings from Last 1 Encounters:  12/28/18 106.1 kg    Ideal Body Weight:  59.1 kg  BMI:  Body mass index is 37.77 kg/m.  Estimated Nutritional Needs:   Kcal:  2100-2300  Protein:  85-95g  Fluid:   2L/day  Clayton Bibles, MS, RD, LDN Inpatient Clinical Dietitian Pager: [phone] After Hours Pager: [phone]

## 2018-12-31 ENCOUNTER — Encounter (HOSPITAL_COMMUNITY): Payer: Self-pay | Admitting: *Deleted

## 2018-12-31 LAB — FERRITIN: Ferritin: 19 ng/mL (ref 11–307)

## 2018-12-31 LAB — CBC WITH DIFFERENTIAL/PLATELET
Abs Immature Granulocytes: 0.07 10*3/uL (ref 0.00–0.07)
Basophils Absolute: 0 10*3/uL (ref 0.0–0.1)
Basophils Relative: 0 %
Eosinophils Absolute: 0 10*3/uL (ref 0.0–0.5)
Eosinophils Relative: 0 %
HCT: 34.8 % — ABNORMAL LOW (ref 36.0–46.0)
Hemoglobin: 9.7 g/dL — ABNORMAL LOW (ref 12.0–15.0)
Immature Granulocytes: 1 %
Lymphocytes Relative: 36 %
Lymphs Abs: 2.1 10*3/uL (ref 0.7–4.0)
MCH: 18.5 pg — ABNORMAL LOW (ref 26.0–34.0)
MCHC: 27.9 g/dL — ABNORMAL LOW (ref 30.0–36.0)
MCV: 66.4 fL — ABNORMAL LOW (ref 80.0–100.0)
Monocytes Absolute: 0.4 10*3/uL (ref 0.1–1.0)
Monocytes Relative: 7 %
Neutro Abs: 3.3 10*3/uL (ref 1.7–7.7)
Neutrophils Relative %: 56 %
Platelets: 403 10*3/uL — ABNORMAL HIGH (ref 150–400)
RBC: 5.24 MIL/uL — ABNORMAL HIGH (ref 3.87–5.11)
RDW: 18.1 % — ABNORMAL HIGH (ref 11.5–15.5)
WBC: 5.9 10*3/uL (ref 4.0–10.5)
nRBC: 0 % (ref 0.0–0.2)

## 2018-12-31 LAB — COMPREHENSIVE METABOLIC PANEL
ALT: 15 U/L (ref 0–44)
AST: 24 U/L (ref 15–41)
Albumin: 3.1 g/dL — ABNORMAL LOW (ref 3.5–5.0)
Alkaline Phosphatase: 104 U/L (ref 38–126)
Anion gap: 10 (ref 5–15)
BUN: 14 mg/dL (ref 6–20)
CO2: 25 mmol/L (ref 22–32)
Calcium: 8.8 mg/dL — ABNORMAL LOW (ref 8.9–10.3)
Chloride: 99 mmol/L (ref 98–111)
Creatinine, Ser: 0.68 mg/dL (ref 0.44–1.00)
GFR calc Af Amer: >60 mL/min (ref >60)
GFR calc non Af Amer: >60 mL/min (ref >60)
Glucose, Bld: 377 mg/dL — ABNORMAL HIGH (ref 70–99)
Potassium: 4.5 mmol/L (ref 3.5–5.1)
Sodium: 134 mmol/L — ABNORMAL LOW (ref 135–145)
Total Bilirubin: 0.4 mg/dL (ref 0.3–1.2)
Total Protein: 6.8 g/dL (ref 6.5–8.1)

## 2018-12-31 LAB — LACTATE DEHYDROGENASE: LDH: 246 U/L — ABNORMAL HIGH (ref 98–192)

## 2018-12-31 LAB — D-DIMER, QUANTITATIVE: D-Dimer, Quant: 0.3 ug/mL-FEU (ref 0.00–0.50)

## 2018-12-31 LAB — C-REACTIVE PROTEIN: CRP: 2.6 mg/dL — ABNORMAL HIGH (ref <1.0)

## 2018-12-31 LAB — MAGNESIUM: Magnesium: 1.9 mg/dL (ref 1.7–2.4)

## 2018-12-31 NOTE — Progress Notes (Signed)
Inpatient Diabetes Program Recommendations  AACE/ADA: New Consensus Statement on Inpatient Glycemic Control (2015)  Target Ranges:  Prepandial:   less than 140 mg/dL      Peak postprandial:   less than 180 mg/dL (1-2 hours)      Critically ill patients:  140 - 180 mg/dL    Review of Glycemic Control Results for Cynthia Parsons, Cynthia Parsons (MRN [id number]) as of 12/31/2018 09:29  Ref. Range 12/28/2018 14:58 12/29/2018 04:09 12/30/2018 02:46 12/31/2018 03:54  Glucose Latest Ref Range: 70 - 99 mg/dL 214 (H) 296 (H) 329 (H) 377 (H)   Diabetes history: None  Current orders for Inpatient glycemic control: None   Ensure Enlive bid between meals BUN/Creat: 14/0.68 Lab glucose elevated with Decadron 6 mg Daily.  Inpatient Diabetes Program Recommendations:    Consider CBGs and Novolog 0-20 units tid + hs scale  Thanks,  Tama Headings RN, MSN, BC-ADM Inpatient Diabetes Coordinator Team Pager [phone] (8a-5p)

## 2018-12-31 NOTE — Progress Notes (Signed)
[facility]                                                                              Patient Demographics  Cynthia Parsons, is a 48 y.o. female, DOB - 07/29/1970, KPT:[id number]  Admit date - 12/28/2018   Admitting Physician Shelly Coss, MD  Outpatient Primary MD for the patient is Patient, No Pcp Per  Outpatient specialists:   LOS - 3  days  Medical records reviewed and are as summarized below:    Chief Complaint  Patient presents with  . Covid  . Shortness of Breath       Brief summary   Patient is a 48 year old female with no significant past medical history presented to ED with shortness of breath, coughing, fevers, chest tightness, diarrhea.  Patient reported that she tested positive for Covid on 11/16.       Subjective:   Cynthia Parsons was seen and examined this morning, has been tapered off supplemental oxygen, her O2 sat has improved from low 90s, currently 95% on room air at rest. But she, becomes easily shortness of breath with minimal exertion. She denies any chest pain.  Continues to complain of diarrhea but has improved with medication. Continues to have some nausea but no vomiting.  Denies have having any fever or chills.   Assessment & Plan    Acute Hypoxic Resp. Failure due to Acute Covid 19 Viral Pneumonitis during the ongoing 2020 Covid 19 Pandemic - POA -Hypoxemia continue to improve, patient was weaned off oxygen to room air, she was satting low 90s, has improved somewhat to 95% currently on room air, with minimum exertion becomes shortness of breath -We will continue as needed supplement oxygen, and pronation  - Patient started on IV dexamethasone, remdesivir.  -She received one dose of Actemra.  she is in full agreement of receiving actemra.  -At this time has been successfully weaned off oxygen to room air, monitoring O2 sat with exertion, ambulation  - DC levaquin, procalcitonin <0.1  - Continue inhalers, cough  suppressants, vitamins, supportive treatment, flutter valve  - Continue to follow labs as below  Recent Labs  Lab 12/28/18 1458 12/28/18 1530 12/29/18 0409 12/30/18 0246 12/31/18 0354  DDIMER  --  0.49 0.50 0.38 0.30  FERRITIN 12  --  15 18 19   CRP 7.0*  --  9.1* 6.0* 2.6*  ALT 18  --  17 16 15   PROCALCITON <0.10  --  <0.10 <0.10  --     Microcytic anemia:  -H&H remained stable - anemia panel consistent with Fe deficiency, recommend outpatient w/u by PCP.   Obesity BMI 37.79, counseled on diet and weight control    Diarrhea: likely due to COVID 19 viral gastroenteritis - will add imodium prn.  - encouraged cont PO diet -Diarrhea improving  Code Status: full code  DVT Prophylaxis:  Lovenox Family Communication:  Discussed the finding and plan of care with the patient, the patient expressed understanding and agree with the current plan.  Patient's daughter was called and notified.   Disposition Plan: Pending clinical status, currently inpatient given acute hypoxic respiratory failure, acute COVID-19 illness.  Once ambulatory without overt symptoms or hypoxia would consider discharge home.  Time Spent in minutes   Procedures:  none  Consultants:   None   Antimicrobials:   Anti-infectives (From admission, onward)   Start     Dose/Rate Route Frequency Ordered Stop   12/30/18 1000  remdesivir 100 mg in sodium chloride 0.9 % 250 mL IVPB     100 mg 500 mL/hr over 30 Minutes Intravenous Every 24 hours 12/28/18 1706 01/02/19 0959   12/29/18 1700  levofloxacin (LEVAQUIN) IVPB 750 mg  Status:  Discontinued     750 mg 100 mL/hr over 90 Minutes Intravenous Every 24 hours 12/28/18 1704 12/29/18 0900   12/29/18 1600  remdesivir 100 mg in sodium chloride 0.9 % 250 mL IVPB     100 mg 500 mL/hr over 30 Minutes Intravenous Every 24 hours 12/28/18 1706 12/29/18 2054   12/28/18 1800  remdesivir 200 mg in sodium chloride 0.9 % 250 mL IVPB     200 mg 500 mL/hr over 30  Minutes Intravenous Once 12/28/18 1706 12/28/18 1900   12/28/18 1615  levofloxacin (LEVAQUIN) IVPB 750 mg     750 mg 100 mL/hr over 90 Minutes Intravenous  Once 12/28/18 1609 12/28/18 1905         Medications  Scheduled Meds: . benzonatate  100 mg Oral TID  . chlorpheniramine-HYDROcodone  5 mL Oral Q12H  . dexamethasone (DECADRON) injection  6 mg Intravenous Daily  . enoxaparin (LOVENOX) injection  55 mg Subcutaneous Q24H  . feeding supplement (ENSURE ENLIVE)  237 mL Oral BID BM  . multivitamin with minerals  1 tablet Oral Daily  . vitamin C  500 mg Oral BID  . zinc sulfate  220 mg Oral Daily   Continuous Infusions: . sodium chloride Stopped (12/30/18 2216)  . remdesivir 100 mg in NS 250 mL 500 mL/hr at 12/31/18 1000   PRN Meds:.sodium chloride, acetaminophen, loperamide, ondansetron (ZOFRAN) IV     Objective:   Vitals:   12/30/18 1333 12/30/18 2130 12/31/18 0515 12/31/18 0848  BP: 126/77 130/70 129/79 128/71  Pulse: 72 65 60 (!) 56  Resp: Temp: 98.2 F (36.8 C) 97.9 F (36.6 C) 97.6 F (36.4 C) 97.8 F (36.6 C)  TempSrc: Oral Oral Oral Oral  SpO2: 98% 97% 97% 95%  Weight:      Height:        Intake/Output Summary (Last 24 hours) at 12/31/2018 1221 Last data filed at 12/31/2018 1000 Gross per 24 hour  Intake 380.87 ml  Output 2 ml  Net 378.87 ml     Wt Readings from Last 3 Encounters:  12/28/18 106.1 kg  09/29/17 106.1 kg  05/19/16 99.8 kg    BP 128/71 (BP Location: Left Arm)   Pulse (!) 56   Temp 97.8 F (36.6 C) (Oral)   Resp 18   Ht  (1.676 m)   Wt 106.1 kg   SpO2 95%   BMI 37.77 kg/m    Physical Exam  Constitution: Awake, alert, cooperative, with minimal exertion experiences shortness of breath Psychiatric: Normal and stable mood and affect, cognition intact,   HEENT: Normocephalic, PERRL, otherwise with in Normal limits  Chest:Chest symmetric Cardio vascular:  S1/S2, RRR, No murmure, No Rubs or Gallops  pulmonary:  Clear to auscultation bilaterally, respirations unlabored, negative wheezes / crackles Abdomen: Soft, non-tender, non-distended, bowel sounds,no masses, no organomegaly Muscular skeletal: Limited exam - in bed, able to move all 4 extremities,  Normal strength,  Neuro: CNII-XII intact. , normal motor and sensation, reflexes intact  Extremities: No pitting edema lower extremities, +2 pulses  Skin: Dry, warm to touch, negative for any Rashes, No open wounds Wounds: per nursing documentation     Data Reviewed:  I have personally reviewed following labs and imaging studies  Micro Results Recent Results (from the past 240 hour(s))  Novel Coronavirus, NAA ([facility])     Status: Abnormal   Collection Time: 12/24/18  3:13 PM   Specimen: Nasopharyngeal(NP) swabs in vial transport medium   NASOPHARYNGE  TESTING  Result Value Ref Range Status   SARS-CoV-2, NAA Detected (A) Not Detected Final    Comment: This nucleic acid amplification test was developed and its performance characteristics determined by [facility]. Nucleic acid amplification tests include PCR and TMA. This test has not been FDA cleared or approved. This test has been authorized by FDA under an Emergency Use Authorization (EUA). This test is only authorized for the duration of time the declaration that circumstances exist justifying the authorization of the emergency use of in vitro diagnostic tests for detection of SARS-CoV-2 virus and/or diagnosis of COVID-19 infection under section 564(b)(1) of the Act, 21 U.S.C. 161WRU-0(A360bbb-3(b) (1), unless the authorization is terminated or revoked sooner. When diagnostic testing is negative, the possibility of a false negative result should be considered in the context of a patient's recent exposures and the presence of clinical signs and symptoms consistent with COVID-19. An individual without symptoms of COVID-19 and who is not shedding SARS-CoV-2 virus would  expect to have  a negative (not detected) result in this assay.   Blood Culture (routine x 2)     Status: None (Preliminary result)   Collection Time: 12/28/18  2:58 PM   Specimen: BLOOD  Result Value Ref Range Status   Specimen Description   Final    BLOOD RIGHT ANTECUBITAL Performed at [facility], [address] [place], [place] [address]    Special Requests   Final    BOTTLES DRAWN AEROBIC AND ANAEROBIC Blood Culture results may not be optimal due to an inadequate volume of blood received in culture bottles Performed at [facility], [address] [place], [place] [address]    Culture   Final    NO GROWTH 3 DAYS Performed at [facility], [address] [place], [place] [address]    Report Status PENDING  Incomplete  Blood Culture (routine x 2)     Status: None (Preliminary result)   Collection Time: 12/28/18  3:30 PM   Specimen: BLOOD LEFT HAND  Result Value Ref Range Status   Specimen Description   Final    BLOOD LEFT HAND Performed at [facility], [address] [place], [place] [address]    Special Requests   Final    BOTTLES DRAWN AEROBIC AND ANAEROBIC Blood Culture results may not be optimal due to an inadequate volume of blood received in culture bottles Performed at [facility], [address] [place], [place] [address]    Culture   Final    NO GROWTH 3 DAYS Performed at [facility], [address] [place], [place] [address]    Report Status PENDING  Incomplete    Radiology Reports Dg Chest Port 1 View  Result Date: 12/28/2018 CLINICAL DATA:  COVID-19, dyspnea EXAM: PORTABLE CHEST 1 VIEW COMPARISON:  None. FINDINGS: Patchy interstitial and airspace opacities with basilar  predominance, right greater than left. Slight blunting of the left costophrenic sulcus may reflect basilar consolidation or trace effusion. No right effusion or pneumothorax. The  cardiomediastinal contours are unremarkable. No acute osseous or soft tissue abnormality. IMPRESSION: 1. Patchy interstitial and airspace opacities with basilar predominance, right greater than left, suspicious for multifocal pneumonia. 2. Slight blunting of the left costophrenic sulcus may reflect basilar consolidation or trace effusion. Electronically Signed   By: Kreg Shropshire M.D.   On: 12/28/2018 16:02    Lab Data:  CBC: Recent Labs  Lab 12/28/18 1458 12/29/18 0409 12/30/18 0246 12/31/18 0737  WBC 5.7 3.9* 3.9* 5.9  NEUTROABS 4.4 2.9 2.1 3.3  HGB 10.4* 9.8* 9.2* 9.7*  HCT 37.6 35.8* 33.7* 34.8*  MCV 66.8* 67.0* 66.9* 66.4*  PLT 316 360 357 403*   Basic Metabolic Panel: Recent Labs  Lab 12/28/18 1458 12/29/18 0409 12/30/18 0246 12/31/18 0354  NA 136 137 136 134*  K 3.4* 4.7 5.1 4.5  CL 102 105 102 99  CO2 25 22 19* 25  GLUCOSE 214* 296* 329* 377*  BUN 5* 8 12 14   CREATININE 0.70 0.65 0.62 0.68  CALCIUM 8.5* 8.9 9.2 8.8*  MG  --  1.8 1.9 1.9   GFR: Estimated Creatinine Clearance: 103.6 mL/min (by C-G formula based on SCr of 0.68 mg/dL). Liver Function Tests: Recent Labs  Lab 12/28/18 1458 12/29/18 0409 12/30/18 0246 12/31/18 0354  AST 24 22 32 24  ALT 18 17 16 15   ALKPHOS 113 103 102 104  BILITOT 0.3 0.4 0.4 0.4  PROT 7.2 7.2 6.8 6.8  ALBUMIN 3.2* 3.1* 3.0* 3.1*   Lipid Profile: Recent Labs    12/28/18 1458  TRIG 82   Thyroid Function Tests: No results for input(s): TSH, T4TOTAL, FREET4, T3FREE, THYROIDAB in the last 72 hours. Anemia Panel: Recent Labs    12/28/18 1458  12/30/18 0246 12/31/18 0354  FERRITIN 12   < > 18 19  TIBC 391  --   --   --   IRON 17*  --   --   --    < > = values in this interval not displayed.   Urine analysis: No results found for: COLORURINE, APPEARANCEUR, LABSPEC, PHURINE, GLUCOSEU, HGBUR, BILIRUBINUR, KETONESUR, PROTEINUR, UROBILINOGEN, NITRITE, LEUKOCYTESUR   01/01/19 M.D. [facility] 12/31/2018, 12:21 PM   Call night coverage person covering after 7pm

## 2018-12-31 NOTE — Progress Notes (Addendum)
  Patients daughter Morene Cecilio was provided an update regarding continued plan of care and plan for possible discharge tomorrow.  Questions and or concerns denied at this time.

## 2018-12-31 NOTE — Plan of Care (Signed)

## 2018-12-31 NOTE — TOC Progression Note (Signed)
Transition of Care [facility]) - Progression Note    Patient Details  Name: Cynthia Parsons MRN: [id number] Date of Birth: 01-22-1971  [facility] CM/SW Contact  Purcell Mouton, RN Phone Number: 12/31/2018, 10:01 AM  Clinical Narrative:    Will follow for discharge needs.    Expected Discharge Plan: Home/Self Care    Expected Discharge Plan and Services Expected Discharge Plan: Home/Self Care   Discharge Planning Services: CM Consult                                           Social Determinants of Health (SDOH) Interventions    Readmission Risk Interventions No flowsheet data found.

## 2019-01-01 LAB — CBC WITH DIFFERENTIAL/PLATELET
Abs Immature Granulocytes: 0.14 10*3/uL — ABNORMAL HIGH (ref 0.00–0.07)
Basophils Absolute: 0 10*3/uL (ref 0.0–0.1)
Basophils Relative: 0 %
Eosinophils Absolute: 0 10*3/uL (ref 0.0–0.5)
Eosinophils Relative: 0 %
HCT: 36.8 % (ref 36.0–46.0)
Hemoglobin: 10.2 g/dL — ABNORMAL LOW (ref 12.0–15.0)
Immature Granulocytes: 2 %
Lymphocytes Relative: 28 %
Lymphs Abs: 2 10*3/uL (ref 0.7–4.0)
MCH: 18.4 pg — ABNORMAL LOW (ref 26.0–34.0)
MCHC: 27.7 g/dL — ABNORMAL LOW (ref 30.0–36.0)
MCV: 66.3 fL — ABNORMAL LOW (ref 80.0–100.0)
Monocytes Absolute: 0.5 10*3/uL (ref 0.1–1.0)
Monocytes Relative: 8 %
Neutro Abs: 4.5 10*3/uL (ref 1.7–7.7)
Neutrophils Relative %: 62 %
Platelets: 460 10*3/uL — ABNORMAL HIGH (ref 150–400)
RBC: 5.55 MIL/uL — ABNORMAL HIGH (ref 3.87–5.11)
RDW: 18.6 % — ABNORMAL HIGH (ref 11.5–15.5)
WBC: 7.2 10*3/uL (ref 4.0–10.5)
nRBC: 0 % (ref 0.0–0.2)

## 2019-01-01 LAB — C-REACTIVE PROTEIN: CRP: 2.4 mg/dL — ABNORMAL HIGH (ref <1.0)

## 2019-01-01 LAB — MAGNESIUM: Magnesium: 1.8 mg/dL (ref 1.7–2.4)

## 2019-01-01 LAB — COMPREHENSIVE METABOLIC PANEL
ALT: 19 U/L (ref 0–44)
AST: 19 U/L (ref 15–41)
Albumin: 3.3 g/dL — ABNORMAL LOW (ref 3.5–5.0)
Alkaline Phosphatase: 132 U/L — ABNORMAL HIGH (ref 38–126)
Anion gap: 11 (ref 5–15)
BUN: 13 mg/dL (ref 6–20)
CO2: 27 mmol/L (ref 22–32)
Calcium: 9.5 mg/dL (ref 8.9–10.3)
Chloride: 98 mmol/L (ref 98–111)
Creatinine, Ser: 0.68 mg/dL (ref 0.44–1.00)
GFR calc Af Amer: >60 mL/min (ref >60)
GFR calc non Af Amer: >60 mL/min (ref >60)
Glucose, Bld: 379 mg/dL — ABNORMAL HIGH (ref 70–99)
Potassium: 4.3 mmol/L (ref 3.5–5.1)
Sodium: 136 mmol/L (ref 135–145)
Total Bilirubin: 0.3 mg/dL (ref 0.3–1.2)
Total Protein: 7.2 g/dL (ref 6.5–8.1)

## 2019-01-01 LAB — LACTATE DEHYDROGENASE: LDH: 194 U/L — ABNORMAL HIGH (ref 98–192)

## 2019-01-01 LAB — FERRITIN: Ferritin: 16 ng/mL (ref 11–307)

## 2019-01-01 LAB — D-DIMER, QUANTITATIVE: D-Dimer, Quant: <0.27 ug/mL-FEU (ref 0.00–0.50)

## 2019-01-01 MED ORDER — METHYLPREDNISOLONE 4 MG PO TBPK: ORAL_TABLET | ORAL | 0 refills | Status: DC

## 2019-01-01 MED ORDER — ACETAMINOPHEN 325 MG PO TABS: 650.0000 mg | ORAL_TABLET | Freq: Four times a day (QID) | ORAL | 0 refills | Status: DC | PRN

## 2019-01-01 MED ORDER — ADULT MULTIVITAMIN W/MINERALS CH: 1.0000 | ORAL_TABLET | Freq: Every day | ORAL | 1 refills | Status: DC

## 2019-01-01 MED ORDER — BENZONATATE 100 MG PO CAPS: 100.0000 mg | ORAL_CAPSULE | Freq: Three times a day (TID) | ORAL | 0 refills | Status: DC

## 2019-01-01 MED FILL — METHYLPREDNISOLONE 4 MG TBP: 4 | 6 days supply | Qty: 21 | Fill #0

## 2019-01-01 MED FILL — BENZONATATE 100 MG CAPS: 100 | 6 days supply | Qty: 20 | Fill #0

## 2019-01-01 NOTE — Progress Notes (Signed)
Pt has been encouraged to continue precautions focusing on mask use at all times and hand hygiene. Discharge instructions reviewed, questions concerns denied.Additional education provided regarding the importance flow meter, incentive spirometer use. Pt will continue prone positioning. No change from am assessment. Pt is out of bed self remains A&Ox4.

## 2019-01-01 NOTE — Discharge Summary (Signed)
Physician Discharge Summary [facility]    Patient: Cynthia Parsons                   Admit date: 12/28/2018   DOB: [birth year]             Discharge date:01/01/2019/10:39 AM [id number]                          PCP: Cynthia Parsons  Disposition: Home  Recommendations for Outpatient Follow-up:   . Follow up: in 2 week  Discharge Condition: Stable   Code Status:   Code Status: Full Code  Diet recommendation: Regular healthy diet   Discharge Diagnoses:    Principal Problem:   Acute respiratory failure due to COVID-19 Cynthia Parsons(HCC) Active Problems:   COVID-19   Diarrhea   Microcytic anemia   History of Present Illness/ [facility] Course [name]/Brief Summary:   Chief Complaint  Patient presents with  . Covid  . Shortness of Breath       Brief summary   Patient is a 48 year old female with no significant past medical history presented to ED with shortness of breath, coughing, fevers, chest tightness, diarrhea.   Patient reported that she tested positive for Covid on 11/16.  12/24/2018 confirmed positive Covid  Discharge summary/Assessment & Plan    Acute Hypoxic Resp. Failure due to Acute Covid 19 Viral Pneumonitis during the ongoing 2020 Covid 19 Pandemic - POA 12/24/2018 confirmed positive Covid Remains symptomatic on this admission -with hypoxia shortness of breath -Hypoxemia continue to improve, patient was weaned off oxygen to room air, satting 99% -On admission - she was satting low 90s, has improved somewhat to 95% currently on room air, with minimum exertion becomes shortness of breath  -Status post treatment with IV dexamethasone, remdesivir.  -She received one dose of Actemra.   -At this time has been successfully weaned off oxygen to room air, monitoring O2 sat with exertion, ambulation  - DC levaquin, procalcitonin <0.1  - Continue inhalers, cough suppressants, vitamins, supportive treatment, flutter valve  - Continue to follow labs as  below  Last Labs          Recent Labs  Lab 12/28/18 1458 12/28/18 1530 12/29/18 0409 12/30/18 0246 12/31/18 0354  DDIMER  --  0.49 0.50 0.38 0.30  FERRITIN 12  --  15 18 19   CRP 7.0*  --  9.1* 6.0* 2.6*  ALT 18  --  17 16 15   PROCALCITON <0.10  --  <0.10 <0.10  --       Microcytic anemia:  -H&H remained stable - anemia panel consistent with Fe deficiency, recommend outpatient w/u by PCP.   Obesity BMI 37.79, counseled on diet and weight control    Diarrhea: likely due to COVID 19 viral gastroenteritis - will add imodium prn.  - encouraged cont PO diet -Diarrhea improving  Code Status: full code  Family Communication:  Discussed the finding and plan of care with the patient, the patient expressed understanding and agree with the current plan.   Disposition Plan:  Discharging home   Discharge Instructions:   Discharge Instructions    Activity as tolerated - No restrictions   Complete by: As directed    Diet - low sodium heart healthy   Complete by: As directed    Discharge instructions   Complete by: As directed    Continue steroid taper   Increase activity slowly   Complete by: As directed  Medication List    STOP taking these medications   Flaxseed Oil 1000 MG Caps   ibuprofen 200 MG tablet Commonly known as: ADVIL   ibuprofen 600 MG tablet Commonly known as: ADVIL   methocarbamol 500 MG tablet Commonly known as: ROBAXIN     TAKE these medications   acetaminophen 325 MG tablet Commonly known as: TYLENOL Take 2 tablets (650 mg total) by mouth every 6 (six) hours as needed for fever.   benzonatate 100 MG capsule Commonly known as: TESSALON Take 1 capsule (100 mg total) by mouth 3 (three) times daily.   methylPREDNISolone 4 MG Tbpk tablet Commonly known as: MEDROL DOSEPAK Please follow instruction Parsons package-taper   multivitamin with minerals Tabs tablet Take 1 tablet by mouth daily. Start taking on: January 02, 2019        Allergies  Allergen Reactions  . Penicillins Hives    Did it involve swelling of the face/tongue/throat, SOB, or low BP? yes Did it involve sudden or severe rash/hives, skin peeling, or any reaction on the inside of your mouth or nose? yes Did you need to seek medical attention at a Parsons or doctor's office? yes When did it last happen?in her 24s If all above answers are "NO", may proceed with cephalosporin use.      Procedures /Studies:   Dg Chest Port 1 View  Result Date: 12/28/2018 CLINICAL DATA:  COVID-19, dyspnea EXAM: PORTABLE CHEST 1 VIEW COMPARISON:  None. FINDINGS: Patchy interstitial and airspace opacities with basilar predominance, right greater than left. Slight blunting of the left costophrenic sulcus may reflect basilar consolidation or trace effusion. No right effusion or pneumothorax. The cardiomediastinal contours are unremarkable. No acute osseous or soft tissue abnormality. IMPRESSION: 1. Patchy interstitial and airspace opacities with basilar predominance, right greater than left, suspicious for multifocal pneumonia. 2. Slight blunting of the left costophrenic sulcus may reflect basilar consolidation or trace effusion. Electronically Signed   By: Cynthia Parsons M.D.   On: 12/28/2018 16:02     Subjective:   Patient was seen and examined 01/01/2019, 10:39 AM Patient stable today. No acute distress.  No issues overnight Stable for discharge.  Discharge Exam:    Vitals:   12/31/18 2045 12/31/18 2110 01/01/19 0511 01/01/19 0904  BP: (!) 150/76 (!) 154/88 132/74 115/70  Pulse: 72 62 (!) 58 68  Resp: (!) 22 20 16 16   Temp: 98 F (36.7 C) (!) 96.5 F (35.8 C) 98.8 F (37.1 C)   TempSrc: Oral  Oral   SpO2: 95% 98% 99% 96%  Weight:      Height:        General: Pt lying comfortably in bed & appears in no obvious distress. Cardiovascular: S1 & S2 heard, RRR, S1/S2 +. No murmurs, rubs, gallops or clicks. No JVD or pedal edema. Respiratory: Clear to  auscultation without wheezing, rhonchi or crackles. No increased work of breathing. Abdominal:  Non-distended, non-tender & soft. No organomegaly or masses appreciated. Normal bowel sounds heard. CNS: Alert and oriented. No focal deficits. Extremities: no edema, no cyanosis    The results of significant diagnostics from this hospitalization (including imaging, microbiology, ancillary and laboratory) are listed below for reference.      Microbiology:   Recent Results (from the past 240 hour(s))  Novel Coronavirus, NAA ([facility])     Status: Abnormal   Collection Time: 12/24/18  3:13 PM   Specimen: Nasopharyngeal(NP) swabs in vial transport medium   NASOPHARYNGE  TESTING  Result Value Ref Range  Status   SARS-CoV-2, NAA Detected (A) Not Detected Final    Comment: This nucleic acid amplification test was developed and its performance characteristics determined by [employer]. Nucleic acid amplification tests include PCR and TMA. This test has not been FDA cleared or approved. This test has been authorized by FDA under an Emergency Use Authorization (EUA). This test is only authorized for the duration of time the declaration that circumstances exist justifying the authorization of the emergency use of in vitro diagnostic tests for detection of SARS-CoV-2 virus and/or diagnosis of COVID-19 infection under section 564(b)(1) of the Act, 21 U.S.C. 191YNW-2(N) (1), unless the authorization is terminated or revoked sooner. When diagnostic testing is negative, the possibility of a false negative result should be considered in the context of a patient's recent exposures and the presence of clinical signs and symptoms consistent with COVID-19. An individual without symptoms of COVID-19 and who is not shedding SARS-CoV-2 virus would  expect to have a negative (not detected) result in this assay.   Blood Culture (routine x 2)     Status: None (Preliminary result)   Collection Time:  12/28/18  2:58 PM   Specimen: BLOOD  Result Value Ref Range Status   Specimen Description   Final    BLOOD RIGHT ANTECUBITAL Performed at [facility], [address], [place], [place] [address]    Special Requests   Final    BOTTLES DRAWN AEROBIC AND ANAEROBIC Blood Culture results may not be optimal due to an inadequate volume of blood received in culture bottles Performed at [facility], [address], [place], [place] [address]    Culture   Final    NO GROWTH 3 DAYS Performed at [facility], [address] [place], [place] [address]    Report Status PENDING  Incomplete  Blood Culture (routine x 2)     Status: None (Preliminary result)   Collection Time: 12/28/18  3:30 PM   Specimen: BLOOD LEFT HAND  Result Value Ref Range Status   Specimen Description   Final    BLOOD LEFT HAND Performed at [facility], [address], [place], [place] [address]    Special Requests   Final    BOTTLES DRAWN AEROBIC AND ANAEROBIC Blood Culture results may not be optimal due to an inadequate volume of blood received in culture bottles Performed at [facility], [address], [place], [place] [address]    Culture   Final    NO GROWTH 3 DAYS Performed at [facility], [address] [place], [place] [address]    Report Status PENDING  Incomplete     Labs:   CBC: Recent Labs  Lab 12/28/18 1458 12/29/18 0409 12/30/18 0246 12/31/18 0737 01/01/19 0432  WBC 5.7 3.9* 3.9* 5.9 7.2  NEUTROABS 4.4 2.9 2.1 3.3 4.5  HGB 10.4* 9.8* 9.2* 9.7* 10.2*  HCT 37.6 35.8* 33.7* 34.8* 36.8  MCV 66.8* 67.0* 66.9* 66.4* 66.3*  PLT 316 360 357 403* 460*   Basic Metabolic Panel: Recent Labs  Lab 12/28/18 1458 12/29/18 0409 12/30/18 0246 12/31/18 0354 01/01/19 0432  NA 136 137 136 134* 136  K 3.4* 4.7 5.1 4.5 4.3  CL 102 105 102 99 98  CO2 25 22 19* 25 27  GLUCOSE 214* 296* 329*  377* 379*  BUN 5* CREATININE 0.70 0.65 0.62 0.68 0.68  CALCIUM 8.5* 8.9 9.2  8.8* 9.5  MG  --  1.8 1.9 1.9 1.8   Liver Function Tests: Recent Labs  Lab 12/28/18 1458 12/29/18 0409 12/30/18 0246 12/31/18 0354 01/01/19 0432  AST 24 22 32 24 19  ALT 18 17 16 15 19   ALKPHOS 113 103 102 104 132*  BILITOT 0.3 0.4 0.4 0.4 0.3  PROT 7.2 7.2 6.8 6.8 7.2  ALBUMIN 3.2* 3.1* 3.0* 3.1* 3.3*   BNP (last 3 results) Recent Labs    12/28/18 1458  BNP 23.1  Anemia work up Recent Labs    12/31/18 0354 01/01/19 0432  FERRITIN 19 16   Urinalysis No results found for: COLORURINE, APPEARANCEUR, LABSPEC, PHURINE, GLUCOSEU, HGBUR, BILIRUBINUR, KETONESUR, PROTEINUR, UROBILINOGEN, NITRITE, LEUKOCYTESUR  Time coordinating discharge: Over 40 minutes  SIGNED: 01/03/19, MD, FACP, FHM. [facility],  Pager [phone]  If 7PM-7AM, please contact night-coverage [email or web address] [facility] 01/01/2019, 10:39 AM

## 2019-01-01 NOTE — Progress Notes (Signed)
Inpatient Diabetes Program Recommendations  AACE/ADA: New Consensus Statement on Inpatient Glycemic Control (2015)  Target Ranges:  Prepandial:   less than 140 mg/dL      Peak postprandial:   less than 180 mg/dL (1-2 hours)      Critically ill patients:  140 - 180 mg/dL    Review of Glycemic Control Results for Cynthia Parsons, Cynthia Parsons (MRN [id number]) as of 01/01/2019 10:28  Ref. Range 12/28/2018 14:58 12/29/2018 04:09 12/30/2018 02:46 12/31/2018 03:54 01/01/2019 04:32  Glucose Latest Ref Range: 70 - 99 mg/dL 214 (H) 296 (H) 329 (H) 377 (H) 379 (H)    Diabetes history: None  Current orders for Inpatient glycemic control: None   Ensure Enlive bid between meals BUN/Creat: 13/0.68 Lab glucose elevated with Decadron 6 mg Daily.  Inpatient Diabetes Program Recommendations:    Consider CBGs and Novolog 0-20 units tid + hs scale  Thanks,  Tama Headings RN, MSN, BC-ADM Inpatient Diabetes Coordinator Team Pager [phone] (8a-5p)

## 2019-01-02 LAB — CULTURE, BLOOD (ROUTINE X 2)
Culture: NO GROWTH
Culture: NO GROWTH

## 2019-01-05 ENCOUNTER — Encounter (HOSPITAL_COMMUNITY): Payer: Self-pay

## 2019-01-05 ENCOUNTER — Other Ambulatory Visit: Payer: Self-pay

## 2019-01-05 ENCOUNTER — Emergency Department (HOSPITAL_COMMUNITY): Payer: Medicaid Other

## 2019-01-05 ENCOUNTER — Emergency Department (HOSPITAL_COMMUNITY)
Admission: EM | Admit: 2019-01-05 | Discharge: 2019-01-05 | Disposition: A | Discharge status: Home / Self Care | Payer: Medicaid Other | Attending: Emergency Medicine | Admitting: Emergency Medicine

## 2019-01-05 DIAGNOSIS — U071 COVID-19: Secondary | ICD-10-CM | POA: Insufficient documentation

## 2019-01-05 DIAGNOSIS — R739 Hyperglycemia, unspecified: Secondary | ICD-10-CM

## 2019-01-05 DIAGNOSIS — Z79899 Other long term (current) drug therapy: Secondary | ICD-10-CM | POA: Insufficient documentation

## 2019-01-05 DIAGNOSIS — B370 Candidal stomatitis: Secondary | ICD-10-CM | POA: Insufficient documentation

## 2019-01-05 HISTORY — DX: Pneumonia, unspecified organism: J18.9

## 2019-01-05 HISTORY — DX: Gastro-esophageal reflux disease without esophagitis: K21.9

## 2019-01-05 LAB — BASIC METABOLIC PANEL
Anion gap: 14 (ref 5–15)
BUN: 21 mg/dL — ABNORMAL HIGH (ref 6–20)
CO2: 24 mmol/L (ref 22–32)
Calcium: 9.7 mg/dL (ref 8.9–10.3)
Chloride: 94 mmol/L — ABNORMAL LOW (ref 98–111)
Creatinine, Ser: 1.11 mg/dL — ABNORMAL HIGH (ref 0.44–1.00)
GFR calc Af Amer: >60 mL/min (ref >60)
GFR calc non Af Amer: 58 mL/min — ABNORMAL LOW (ref >60)
Glucose, Bld: 574 mg/dL (ref 70–99)
Potassium: 4.4 mmol/L (ref 3.5–5.1)
Sodium: 132 mmol/L — ABNORMAL LOW (ref 135–145)

## 2019-01-05 LAB — CBC WITH DIFFERENTIAL/PLATELET
Abs Immature Granulocytes: 0.09 10*3/uL — ABNORMAL HIGH (ref 0.00–0.07)
Basophils Absolute: 0 10*3/uL (ref 0.0–0.1)
Basophils Relative: 0 %
Eosinophils Absolute: 0 10*3/uL (ref 0.0–0.5)
Eosinophils Relative: 0 %
HCT: 39.7 % (ref 36.0–46.0)
Hemoglobin: 11.3 g/dL — ABNORMAL LOW (ref 12.0–15.0)
Immature Granulocytes: 1 %
Lymphocytes Relative: 17 %
Lymphs Abs: 2.5 10*3/uL (ref 0.7–4.0)
MCH: 18.9 pg — ABNORMAL LOW (ref 26.0–34.0)
MCHC: 28.5 g/dL — ABNORMAL LOW (ref 30.0–36.0)
MCV: 66.3 fL — ABNORMAL LOW (ref 80.0–100.0)
Monocytes Absolute: 0.9 10*3/uL (ref 0.1–1.0)
Monocytes Relative: 6 %
Neutro Abs: 11.1 10*3/uL — ABNORMAL HIGH (ref 1.7–7.7)
Neutrophils Relative %: 76 %
Platelets: 593 10*3/uL — ABNORMAL HIGH (ref 150–400)
RBC: 5.99 MIL/uL — ABNORMAL HIGH (ref 3.87–5.11)
RDW: 18.9 % — ABNORMAL HIGH (ref 11.5–15.5)
WBC: 14.7 10*3/uL — ABNORMAL HIGH (ref 4.0–10.5)
nRBC: 0 % (ref 0.0–0.2)

## 2019-01-05 LAB — HEMOGLOBIN A1C
Hgb A1c MFr Bld: 11.6 % — ABNORMAL HIGH (ref 4.8–5.6)
Mean Plasma Glucose: 286.22 mg/dL

## 2019-01-05 LAB — CBG MONITORING, ED: Glucose-Capillary: 459 mg/dL — ABNORMAL HIGH (ref 70–99)

## 2019-01-05 MED ORDER — METFORMIN HCL 500 MG PO TABS: 500.0000 mg | ORAL_TABLET | Freq: Two times a day (BID) | ORAL | 0 refills | Status: DC

## 2019-01-05 MED ORDER — LACTATED RINGERS IV BOLUS
1000.0000 mL | Freq: Once | INTRAVENOUS | Status: AC
  Administered 2019-01-05: 1000 mL via INTRAVENOUS

## 2019-01-05 MED ORDER — FLUCONAZOLE 200 MG PO TABS: 200.0000 mg | ORAL_TABLET | Freq: Every day | ORAL | 0 refills | Status: AC

## 2019-01-05 MED ORDER — SODIUM CHLORIDE 0.9 % IV BOLUS
1000.0000 mL | Freq: Once | INTRAVENOUS | Status: AC
  Administered 2019-01-05: 15:00:00 1000 mL via INTRAVENOUS

## 2019-01-05 MED ORDER — SODIUM CHLORIDE 0.9 % IV BOLUS
1000.0000 mL | Freq: Once | INTRAVENOUS | Status: AC
  Administered 2019-01-05: 1000 mL via INTRAVENOUS

## 2019-01-05 MED ORDER — FLUCONAZOLE 200 MG PO TABS
200.0000 mg | ORAL_TABLET | Freq: Once | ORAL | Status: AC
  Administered 2019-01-05: 15:00:00 200 mg via ORAL
  Filled 2019-01-05: qty 1

## 2019-01-05 MED ORDER — INSULIN ASPART 100 UNIT/ML ~~LOC~~ SOLN
10.0000 [IU] | Freq: Once | SUBCUTANEOUS | Status: AC
  Administered 2019-01-05: 16:00:00 10 [IU] via SUBCUTANEOUS
  Filled 2019-01-05: qty 0.1

## 2019-01-05 NOTE — ED Notes (Signed)
Date and time results received: 01/05/19 1555 (use smartphrase ".now" to insert current time)  Test: Glucose Critical Value: 574  Name of Provider Notified: Josh, [place]  Orders Received? Or Actions Taken?: Orders Received - See Orders for details

## 2019-01-05 NOTE — ED Notes (Signed)
Josh, PA at bedside. °

## 2019-01-05 NOTE — ED Provider Notes (Addendum)
[facility] DEPT Provider Note   CSN: [id number] Arrival date & time: 01/05/19  1349     History   Chief Complaint Chief Complaint  Patient presents with  . Allergic Reaction    HPI Cynthia Parsons is a 48 y.o. female.     Patient presents to the emergency department with concern of allergic reaction.  Patient was admitted to the hospital from 11/20-11/24/20 for respiratory failure due to coronavirus.  Patient was discharged home on a Medrol Dosepak, multivitamins, Tessalon for cough.  Patient noted little white bumps on her tongue and inside her mouth and felt like she was having a lump in the back of her throat starting 3 days ago.  This was persisting.  She is also had intermittent hives and itching to her upper chest and breasts.  She took Benadryl for this today which improved her symptoms.  She feels like there is a ball in the back of her throat when she swallows and feels like her tongue is swollen.  She wanted to get checked for the symptoms.  She states that her shortness of breath was worse today, however she does note increasing anxiety due to her other symptoms.  She continues to have a cough.  No fevers.  No chest or abdominal pain.  No vomiting or diarrhea.  No lightheadedness or syncope.  She has a history to penicillin only.  No new foods or other exposures.     Past Medical History:  Diagnosis Date  . GERD (gastroesophageal reflux disease)   . Gout 09/28/2017  . Pneumonia     Patient Active Problem List   Diagnosis Date Noted  . Acute respiratory failure due to COVID-19 ([place]) 12/29/2018  . Diarrhea 12/29/2018  . Microcytic anemia 12/29/2018  . COVID-19 12/28/2018    Past Surgical History:  Procedure Laterality Date  . BREAST SURGERY    . CHOLECYSTECTOMY       OB History   No obstetric history on file.      Home Medications    Prior to Admission medications   Medication Sig Start Date End Date Taking?  Authorizing Provider  acetaminophen (TYLENOL) 325 MG tablet Take 2 tablets (650 mg total) by mouth every 6 (six) hours as needed for fever. 01/01/19   Shahmehdi, Valeria Batman, MD  benzonatate (TESSALON) 100 MG capsule Take 1 capsule (100 mg total) by mouth 3 (three) times daily. 01/01/19   Deatra James, MD  methylPREDNISolone (MEDROL DOSEPAK) 4 MG TBPK tablet Please follow instruction per package-taper 01/01/19   Deatra James, MD  Multiple Vitamin (MULTIVITAMIN WITH MINERALS) TABS tablet Take 1 tablet by mouth daily. 01/02/19   Deatra James, MD    Family History Family History  Problem Relation Age of Onset  . Hypertension Mother   . Diabetes Mother   . Stroke Mother   . Hypertension Father   . Diabetes Father     Social History Social History   Tobacco Use  . Smoking status: Never Smoker  . Smokeless tobacco: Never Used  Substance Use Topics  . Alcohol use: No  . Drug use: No     Allergies   Penicillins   Review of Systems Review of Systems  Constitutional: Negative for fever.  HENT: Positive for trouble swallowing. Negative for facial swelling.   Eyes: Negative for redness.  Respiratory: Positive for cough. Negative for shortness of breath, wheezing and stridor.   Cardiovascular: Negative for chest pain.  Gastrointestinal: Negative for  nausea and vomiting.  Musculoskeletal: Negative for myalgias.  Skin: Positive for color change and rash.  Neurological: Negative for light-headedness.  Psychiatric/Behavioral: Negative for confusion.     Physical Exam Updated Vital Signs BP (!) 169/102 (BP Location: Left Arm)   Pulse (!) 125   Temp 98.3 F (36.8 C) (Oral)   Resp 20   Ht  (1.676 m)   LMP 01/05/2019   SpO2 94%   BMI 37.77 kg/m   Physical Exam Vitals signs and nursing note reviewed.  Constitutional:      Appearance: She is well-developed.  HENT:     Head: Normocephalic and atraumatic.     Right Ear: Tympanic membrane, ear canal and  external ear normal.     Left Ear: Tympanic membrane, ear canal and external ear normal.     Mouth/Throat:     Comments: Patient with signs of oral thrush noted on her buccal mucosa as well as the back of her tongue and posterior oropharynx.  No gross tongue swelling. Eyes:     General:        Right eye: No discharge.        Left eye: No discharge.     Conjunctiva/sclera: Conjunctivae normal.  Neck:     Musculoskeletal: Normal range of motion and neck supple.  Cardiovascular:     Rate and Rhythm: Regular rhythm. Tachycardia present.     Heart sounds: Normal heart sounds.  Pulmonary:     Effort: Pulmonary effort is normal.     Breath sounds: Normal breath sounds.     Comments: Patient is mildly tachypneic without any respiratory distress or accessory muscle use. Abdominal:     Palpations: Abdomen is soft.     Tenderness: There is no abdominal tenderness.  Skin:    General: Skin is warm and dry.     Comments: Denies any current urticaria on her upper chest states that this is improved with Benadryl earlier.  Neurological:     Mental Status: She is alert.      ED Treatments / Results  Labs (all labs ordered are listed, but only abnormal results are displayed) Labs Reviewed  CBC WITH DIFFERENTIAL/PLATELET - Abnormal; Notable for the following components:      Result Value   WBC 14.7 (*)    RBC 5.99 (*)    Hemoglobin 11.3 (*)    MCV 66.3 (*)    MCH 18.9 (*)    MCHC 28.5 (*)    RDW 18.9 (*)    Platelets 593 (*)    Neutro Abs 11.1 (*)    Abs Immature Granulocytes 0.09 (*)    All other components within normal limits  BASIC METABOLIC PANEL - Abnormal; Notable for the following components:   Sodium 132 (*)    Chloride 94 (*)    Glucose, Bld 574 (*)    BUN 21 (*)    Creatinine, Ser 1.11 (*)    GFR calc non Af Amer 58 (*)    All other components within normal limits  CBG MONITORING, ED - Abnormal; Notable for the following components:   Glucose-Capillary 459 (*)    All  other components within normal limits  HEMOGLOBIN A1C   ED ECG REPORT   Date: 01/05/2019  Rate: 114  Rhythm: sinus tachycardia  QRS Axis: normal  Intervals: normal  ST/T Wave abnormalities: nonspecific T wave changes  Conduction Disutrbances:none  Narrative Interpretation:   Old EKG Reviewed: unchanged  I have personally reviewed the EKG tracing  and agree with the computerized printout as noted.  Radiology Dg Chest Port 1 View  Result Date: 01/05/2019 CLINICAL DATA:  Shortness of breath.  COVID-19 positive EXAM: PORTABLE CHEST 1 VIEW COMPARISON:  December 28, 2018 FINDINGS: Lungs are clear. Heart size and pulmonary vascularity are normal. No adenopathy. No bone lesions. IMPRESSION: No edema or consolidation.  No evident adenopathy. Electronically Signed   By: Bretta BangWilliam  Woodruff III M.D.   On: 01/05/2019 15:51    Procedures Procedures (including critical care time)  Medications Ordered in ED Medications  sodium chloride 0.9 % bolus 1,000 mL (0 mLs Intravenous Stopped 01/05/19 1630)  fluconazole (DIFLUCAN) tablet 200 mg (200 mg Oral Given 01/05/19 1521)  sodium chloride 0.9 % bolus 1,000 mL (1,000 mLs Intravenous New Bag/Given 01/05/19 1619)  insulin aspart (novoLOG) injection 10 Units (10 Units Subcutaneous Given 01/05/19 1618)  lactated ringers bolus 1,000 mL (1,000 mLs Intravenous New Bag/Given 01/05/19 1745)     Initial Impression / Assessment and Plan / ED Course  I have reviewed the triage vital signs and the nursing notes.  Pertinent labs & imaging results that were available during my care of the patient were reviewed by me and considered in my medical decision making (see chart for details).        Patient seen and examined.  I do not suspect significant allergic reaction or anaphylaxis with this patient.  She has what appears to be oral candidiasis as a result of oral steroids.  She does not have any angioedema.  No vomiting or diarrhea.  She has had some  urticaria on her upper chest which is now controlled with antihistamines.  Patient will be treated with Diflucan.  Patient has however had tachycardia and some tachypnea.  Given this we will give a fluid bolus and recheck labs and a chest x-ray.  EKG reviewed without signs of ischemia.  Vital signs reviewed and are as follows: BP (!) 152/90   Pulse (!) 115   Temp 98.3 F (36.8 C) (Oral)   Resp (!) 26   Ht 5\' 6"  (1.676 m)   LMP 01/05/2019   SpO2 97%   BMI 37.77 kg/m   5:21 PM reviewed lab work from patient's hospitalization.  She had blood sugars mostly in the mid to upper 300s but does not have a history of diabetes and was not treated for such.  No hemoglobin A1c was ordered.  We will recheck CBG.  Patient has received 2 L of IV fluid with improvement in pulse rate.  6:03 PM Plan: one additional liter LR. Pt has been up to urinate.   Will start diflucan and metformin 500mg  bid.  Patient on side effects of Metformin.  Placed case manager consult requesting help with PCP follow-up as patient does not currently have a primary care doctor.  Patient updated on all results and agrees with plan.  She is comfortable with discharge after fluids.  A1c sent and is pending.   Patient urged to return with worsening symptoms or other concerns. Patient verbalized understanding and agrees with plan.    Final Clinical Impressions(s) / ED Diagnoses   Final diagnoses:  Oral candidiasis  Hyperglycemia  COVID-19   Oral thrush: Patient thought this was an allergic reaction.  She does not have any signs of anaphylaxis today.  Oral thrush likely caused by steroid use in setting of likely new onset diabetes/hyperglycemia.  Continue benadryl as needed for any hives.   Hyperglycemia: Noted during previous admission, more profound today, likely  exacerbated by steroid use.  Patient started on Metformin.  No DKA.  Requested case management help to establish primary care follow-up.  This has likely  contributed to dehydration -- causing hemoconcentration in some labs.  COVID-19: Patient is not hypoxic.  Her x-ray is much improved from previous. She seems to be recovering from this and can continue with ongoing therapies.   ED Discharge Orders         Ordered    fluconazole (DIFLUCAN) 200 MG tablet  Daily     01/05/19 1808    metFORMIN (GLUCOPHAGE) 500 MG tablet  2 times daily     01/05/19 1808              Renne Crigler, PA-C 01/05/19 1813    Loren Racer, MD 01/05/19 2139

## 2019-01-05 NOTE — Discharge Instructions (Signed)
Please read and follow all provided instructions.  Your diagnoses today include:  1. Oral candidiasis   2. Hyperglycemia   3. COVID-19     Tests performed today include:  Blood counts and electrolytes -very high blood sugar today  Hemoglobin A1c -test for diabetes that is pending and will need followed up  Chest x-ray -improved from previous admission for coronavirus  Vital signs. See below for your results today.   Medications prescribed:   Diflucan -medication for thrush   Metformin -medication to help control blood sugars  Take any prescribed medications only as directed.  Home care instructions:  Follow any educational materials contained in this packet.  BE VERY CAREFUL not to take multiple medicines containing Tylenol (also called acetaminophen). Doing so can lead to an overdose which can damage your liver and cause liver failure and possibly death.   Follow-up instructions: Please follow-up with your primary care provider in the next 3 days for further evaluation of your symptoms.   Return instructions:   Please return to the Emergency Department if you experience worsening symptoms.   Please return if you have any other emergent concerns.  Additional Information:  Your vital signs today were: BP (!) 155/89    Pulse 95    Temp 98.3 F (36.8 C) (Oral)    Resp (!) 25    Ht 5\' 6"  (1.676 m)    LMP 01/05/2019    SpO2 97%    BMI 37.77 kg/m  If your blood pressure (BP) was elevated above 135/85 this visit, please have this repeated by your doctor within one month. --------------

## 2019-01-05 NOTE — ED Triage Notes (Signed)
Patieat reports that she was recently discharged from the hospital with Covid-19. Patient states she had a reaction to her medication x 3 days ago with bumps in her mouth 3 days ago and hives and itching 2 days. Patient has swelling of her tongue.  Patient states she took Benadryl 50 mg at 1330 today. Patient states since taking Benadryl itching has stopped. patient is able to speak in complete sentences and is able to swallow without difficulty

## 2019-01-07 ENCOUNTER — Emergency Department (HOSPITAL_COMMUNITY)
Admission: EM | Admit: 2019-01-07 | Discharge: 2019-01-07 | Disposition: A | Discharge status: Home / Self Care | Payer: Medicaid Other | Attending: Emergency Medicine | Admitting: Emergency Medicine

## 2019-01-07 ENCOUNTER — Encounter (HOSPITAL_COMMUNITY): Payer: Self-pay | Admitting: Emergency Medicine

## 2019-01-07 ENCOUNTER — Telehealth: Payer: Self-pay | Admitting: *Deleted

## 2019-01-07 ENCOUNTER — Ambulatory Visit: Payer: Self-pay

## 2019-01-07 ENCOUNTER — Other Ambulatory Visit: Payer: Self-pay

## 2019-01-07 ENCOUNTER — Emergency Department (HOSPITAL_COMMUNITY)
Admission: EM | Admit: 2019-01-07 | Discharge: 2019-01-07 | Disposition: A | Discharge status: Home / Self Care | Payer: Medicaid Other

## 2019-01-07 ENCOUNTER — Telehealth: Payer: Self-pay

## 2019-01-07 DIAGNOSIS — Z79899 Other long term (current) drug therapy: Secondary | ICD-10-CM | POA: Insufficient documentation

## 2019-01-07 DIAGNOSIS — R631 Polydipsia: Secondary | ICD-10-CM | POA: Insufficient documentation

## 2019-01-07 DIAGNOSIS — Z7984 Long term (current) use of oral hypoglycemic drugs: Secondary | ICD-10-CM | POA: Insufficient documentation

## 2019-01-07 DIAGNOSIS — E1165 Type 2 diabetes mellitus with hyperglycemia: Secondary | ICD-10-CM | POA: Insufficient documentation

## 2019-01-07 DIAGNOSIS — R358 Other polyuria: Secondary | ICD-10-CM | POA: Insufficient documentation

## 2019-01-07 DIAGNOSIS — H538 Other visual disturbances: Secondary | ICD-10-CM | POA: Insufficient documentation

## 2019-01-07 LAB — I-STAT CHEM 8, ED
BUN: 16 mg/dL (ref 6–20)
Calcium, Ion: 1.22 mmol/L (ref 1.15–1.40)
Chloride: 97 mmol/L — ABNORMAL LOW (ref 98–111)
Creatinine, Ser: 0.7 mg/dL (ref 0.44–1.00)
Glucose, Bld: 491 mg/dL — ABNORMAL HIGH (ref 70–99)
HCT: 43 % (ref 36.0–46.0)
Hemoglobin: 14.6 g/dL (ref 12.0–15.0)
Potassium: 4.8 mmol/L (ref 3.5–5.1)
Sodium: 133 mmol/L — ABNORMAL LOW (ref 135–145)
TCO2: 25 mmol/L (ref 22–32)

## 2019-01-07 LAB — URINALYSIS, ROUTINE W REFLEX MICROSCOPIC
Bacteria, UA: NONE SEEN
Bilirubin Urine: NEGATIVE
Glucose, UA: >=500 mg/dL — AB
Ketones, ur: 5 mg/dL — AB
Leukocytes,Ua: NEGATIVE
Nitrite: NEGATIVE
Protein, ur: NEGATIVE mg/dL
Specific Gravity, Urine: 1.039 — ABNORMAL HIGH (ref 1.005–1.030)
pH: 6 (ref 5.0–8.0)

## 2019-01-07 LAB — CBC WITH DIFFERENTIAL/PLATELET
Abs Immature Granulocytes: 0.21 10*3/uL — ABNORMAL HIGH (ref 0.00–0.07)
Basophils Absolute: 0 10*3/uL (ref 0.0–0.1)
Basophils Relative: 0 %
Eosinophils Absolute: 0 10*3/uL (ref 0.0–0.5)
Eosinophils Relative: 0 %
HCT: 41 % (ref 36.0–46.0)
Hemoglobin: 11.4 g/dL — ABNORMAL LOW (ref 12.0–15.0)
Immature Granulocytes: 2 %
Lymphocytes Relative: 20 %
Lymphs Abs: 2.7 10*3/uL (ref 0.7–4.0)
MCH: 18.8 pg — ABNORMAL LOW (ref 26.0–34.0)
MCHC: 27.8 g/dL — ABNORMAL LOW (ref 30.0–36.0)
MCV: 67.5 fL — ABNORMAL LOW (ref 80.0–100.0)
Monocytes Absolute: 0.7 10*3/uL (ref 0.1–1.0)
Monocytes Relative: 5 %
Neutro Abs: 9.9 10*3/uL — ABNORMAL HIGH (ref 1.7–7.7)
Neutrophils Relative %: 73 %
Platelets: 563 10*3/uL — ABNORMAL HIGH (ref 150–400)
RBC: 6.07 MIL/uL — ABNORMAL HIGH (ref 3.87–5.11)
RDW: 19.7 % — ABNORMAL HIGH (ref 11.5–15.5)
WBC: 13.6 10*3/uL — ABNORMAL HIGH (ref 4.0–10.5)
nRBC: 0 % (ref 0.0–0.2)

## 2019-01-07 LAB — CBG MONITORING, ED
Glucose-Capillary: 528 mg/dL (ref 70–99)
Glucose-Capillary: 371 mg/dL — ABNORMAL HIGH (ref 70–99)

## 2019-01-07 MED ORDER — BLOOD GLUCOSE MONITOR KIT: PACK | 0 refills | Status: DC

## 2019-01-07 MED ORDER — SODIUM CHLORIDE 0.9 % IV BOLUS
500.0000 mL | Freq: Once | INTRAVENOUS | Status: AC
  Administered 2019-01-07: 500 mL via INTRAVENOUS

## 2019-01-07 NOTE — Telephone Encounter (Signed)
Message received from Jonnie Finner, RN CM requesting a hospital follow up appointment for the patient. Informed her that an appt has been scheduled for 01/16/2019 @ 1510 at [place].  She will notify the patient.  The appt at [facility] has been cancelled.

## 2019-01-07 NOTE — ED Notes (Signed)
Wells Guiles, RN aware of blood sugar of 528

## 2019-01-07 NOTE — Telephone Encounter (Signed)
Patient called stating that sh has just been released from the ER for Hyperglycemia.  She states that it was over 500  She has blurred vision dry mouth and dizziness and the symptoms are ongoing.  She states she has never been diabetic. She was placed on prednisone for COVID-19 symptoms. Her BS when discharged from ER was 350. Her significant other is going now to the pharmacy to get a meter and test strips.  She was discharged after COVID-19 on Metformin 500mg  BID. Patient will go back to ER for her symptoms. She was encouraged to call back and establish care with a provider. Patient verbalized understanding of all information. Reason for Disposition . [1] New onset diabetes suspected (e.g., frequent urination, weak, weight loss) AND [2] vomiting or rapid breathing  Answer Assessment - Initial Assessment Questions 1. BLOOD GLUCOSE: "What is your blood glucose level?"      Over 500 in ER 350 on discharge today 2. ONSET: "When did you check the blood glucose?"     Has not  3. USUAL RANGE: "What is your glucose level usually?" (e.g., usual fasting morning value, usual evening value)     unsure 4. KETONES: "Do you check for ketones (urine or blood test strips)?" If yes, ask: "What does the test show now?"     No 5. TYPE 1 or 2:  "Do you know what type of diabetes you have?"  (e.g., Type 1, Type 2, Gestational; doesn't know)      Unsure has never been diagnosed 6. INSULIN: "Do you take insulin?" "What type of insulin(s) do you use? What is the mode of delivery? (syringe, pen; injection or pump)?"      none 7. DIABETES PILLS: "Do you take any pills for your diabetes?" If yes, ask: "Have you missed taking any pills recently?"    metformin 8. OTHER SYMPTOMS: "Do you have any symptoms?" (e.g., fever, frequent urination, difficulty breathing, dizziness, weakness, vomiting)     Frequent urination, dizziness,dry mouth 9. PREGNANCY: "Is there any chance you are pregnant?" "When was your last menstrual  period?"     N/A  Protocols used: DIABETES - HIGH BLOOD SUGAR-A-AH

## 2019-01-07 NOTE — ED Notes (Signed)
Josh, EMT, ambulated pt in room and reported that her oxygen sats remained in the mid to high 90s

## 2019-01-07 NOTE — ED Triage Notes (Signed)
Pt was seen yesterday and was told that she was diabetic. Pt tested positive for covid on 11/18. Pt has blurred vision and feels weak. Pt started metformin yesterday morning and at 6pm yesterday

## 2019-01-07 NOTE — Discharge Instructions (Addendum)
Thank you for allowing me to care for you today in the Emergency Department.   Your symptoms today were most likely due to your blood sugar being high.  Your A1C level was high when you were recently admitted to the hospital.  This is a test used to diagnose diabetes mellitus.  Unfortunately, your blood sugars are probably been much higher over the last few days because you are taking prednisone.  I would not take the last dose of prednisone that you have left.  Continue to take Metformin as prescribed.  Your blood sugar may be high over the next few days, it is important to lower it slowly and not rapidly.  You need to call your primary care provider first thing this morning to schedule follow-up appointment.  Your medications will most likely need to be readjusted based on rechecking your blood sugars.  I have given you a prescription for a glucometer, lancets, and strips today.  Primary care can also help you get established with diabetes mellitus education.  You should return to the emergency department if your blood sugar is high and you were having symptoms, which include peeing frequently, severe thirst, changes in your vision, severe dizziness, or if you start to become confused, have vomiting, severe belly pain, or other new, concerning symptoms.

## 2019-01-07 NOTE — ED Provider Notes (Signed)
[facility] DEPT Provider Note   CSN: [id number] Arrival date & time: 01/07/19  0048     History   Chief Complaint Chief Complaint  Patient presents with  . Dizziness  . Hyperglycemia    HPI Cynthia Parsons is a 48 y.o. female with a history of COVID-19 who presents to the emergency department with dizziness, blurred vision, polyuria, and polydipsia.  The patient reports she became acutely dizzy with blurred vision earlier today.  No known aggravating or alleviating factors.  Denies feeling off balance, tinnitus, headache, numbness, weakness, slurred speech, visual changes, confusion, nausea, vomiting, diarrhea, abdominal pain, chest pain, or shortness of breath.  Reports symptoms have been improving since onset.  No treatment prior to arrival.  She was recently admitted for acute respiratory failure due to COVID-19 from November 20-24.  She was discharged home on oral corticosteroids.  She returned on November 28 and was found to have oral candidiasis and she was hyperglycemic so she was started on Metformin.  She has not been able to follow-up with primary care due to the Thanksgiving holiday weekend, but planned to call this morning to schedule a follow-up appointment as she was diagnosed with diabetes.     The history is provided by the patient. No language interpreter was used.    Past Medical History:  Diagnosis Date  . GERD (gastroesophageal reflux disease)   . Gout 09/28/2017  . Pneumonia     Patient Active Problem List   Diagnosis Date Noted  . Acute respiratory failure due to COVID-19 ([place]) 12/29/2018  . Diarrhea 12/29/2018  . Microcytic anemia 12/29/2018  . COVID-19 12/28/2018    Past Surgical History:  Procedure Laterality Date  . BREAST SURGERY    . CHOLECYSTECTOMY       OB History   No obstetric history on file.      Home Medications    Prior to Admission medications   Medication Sig Start Date End Date Taking?  Authorizing Provider  acetaminophen (TYLENOL) 325 MG tablet Take 2 tablets (650 mg total) by mouth every 6 (six) hours as needed for fever. 01/01/19  Yes Shahmehdi, Seyed A, MD  benzonatate (TESSALON) 100 MG capsule Take 1 capsule (100 mg total) by mouth 3 (three) times daily. Patient taking differently: Take 100 mg by mouth 3 (three) times daily as needed for cough.  01/01/19  Yes Shahmehdi, Seyed A, MD  fluconazole (DIFLUCAN) 200 MG tablet Take 1 tablet (200 mg total) by mouth daily for 10 days. 01/05/19 01/15/19 Yes Carlisle Cater, PA-C  metFORMIN (GLUCOPHAGE) 500 MG tablet Take 1 tablet (500 mg total) by mouth 2 (two) times daily. 01/05/19  Yes Carlisle Cater, PA-C  methylPREDNISolone (MEDROL DOSEPAK) 4 MG TBPK tablet Please follow instruction per package-taper 01/01/19  Yes Shahmehdi, Valeria Batman, MD  Multiple Vitamin (MULTIVITAMIN WITH MINERALS) TABS tablet Take 1 tablet by mouth daily. 01/02/19  Yes Shahmehdi, Valeria Batman, MD  blood glucose meter kit and supplies KIT Dispense based on patient and insurance preference. Use up to four times daily as directed. (FOR ICD-9 250.00, 250.01). 01/07/19   Dartanian Knaggs A, PA-C    Family History Family History  Problem Relation Age of Onset  . Hypertension Mother   . Diabetes Mother   . Stroke Mother   . Hypertension Father   . Diabetes Father     Social History Social History   Tobacco Use  . Smoking status: Never Smoker  . Smokeless tobacco: Never Used  Substance  Use Topics  . Alcohol use: No  . Drug use: No     Allergies   Penicillins   Review of Systems Review of Systems  Constitutional: Negative for activity change, chills and fever.  Eyes: Positive for visual disturbance.  Respiratory: Negative for shortness of breath and wheezing.   Cardiovascular: Negative for chest pain and palpitations.  Gastrointestinal: Negative for abdominal pain, diarrhea, nausea and vomiting.  Endocrine: Positive for polydipsia and polyuria.   Genitourinary: Negative for dysuria.  Musculoskeletal: Negative for back pain, neck pain and neck stiffness.  Skin: Negative for rash.  Allergic/Immunologic: Negative for immunocompromised state.  Neurological: Positive for dizziness. Negative for seizures, syncope, speech difficulty, weakness, numbness and headaches.  Psychiatric/Behavioral: Negative for confusion.     Physical Exam Updated Vital Signs BP 133/81   Pulse 85   Temp 98.3 F (36.8 C) (Oral)   Resp 20   Ht _0  (1.676 m)   Wt 104.3 kg   LMP 01/05/2019   SpO2 94%   BMI 37.12 kg/m   Physical Exam Vitals signs and nursing note reviewed.  Constitutional:      General: She is not in acute distress.    Appearance: She is not ill-appearing, toxic-appearing or diaphoretic.     Comments: Well-appearing.  No acute distress.  HENT:     Head: Normocephalic.  Eyes:     Extraocular Movements: Extraocular movements intact.     Conjunctiva/sclera: Conjunctivae normal.     Pupils: Pupils are equal, round, and reactive to light.  Neck:     Musculoskeletal: Neck supple.  Cardiovascular:     Rate and Rhythm: Normal rate and regular rhythm.     Heart sounds: No murmur. No friction rub. No gallop.   Pulmonary:     Effort: Pulmonary effort is normal. No respiratory distress.     Breath sounds: No stridor. No wheezing, rhonchi or rales.  Chest:     Chest wall: No tenderness.  Abdominal:     General: There is no distension.     Palpations: Abdomen is soft. There is no mass.     Tenderness: There is no abdominal tenderness. There is no right CVA tenderness, left CVA tenderness, guarding or rebound.     Hernia: No hernia is present.  Skin:    General: Skin is warm.     Findings: No rash.  Neurological:     General: No focal deficit present.     Mental Status: She is alert.     Cranial Nerves: No cranial nerve deficit.     Sensory: No sensory deficit.     Motor: No weakness.     Coordination: Coordination normal.      Gait: Gait normal.  Psychiatric:        Behavior: Behavior normal.      ED Treatments / Results  Labs (all labs ordered are listed, but only abnormal results are displayed) Labs Reviewed  CBC WITH DIFFERENTIAL/PLATELET - Abnormal; Notable for the following components:      Result Value   WBC 13.6 (*)    RBC 6.07 (*)    Hemoglobin 11.4 (*)    MCV 67.5 (*)    MCH 18.8 (*)    MCHC 27.8 (*)    RDW 19.7 (*)    Platelets 563 (*)    Neutro Abs 9.9 (*)    Abs Immature Granulocytes 0.21 (*)    All other components within normal limits  URINALYSIS, ROUTINE W REFLEX MICROSCOPIC - Abnormal; Notable  for the following components:   Color, Urine STRAW (*)    Specific Gravity, Urine 1.039 (*)    Glucose, UA >=500 (*)    Hgb urine dipstick MODERATE (*)    Ketones, ur 5 (*)    All other components within normal limits  I-STAT CHEM 8, ED - Abnormal; Notable for the following components:   Sodium 133 (*)    Chloride 97 (*)    Glucose, Bld 491 (*)    All other components within normal limits  CBG MONITORING, ED - Abnormal; Notable for the following components:   Glucose-Capillary 528 (*)    All other components within normal limits  CBG MONITORING, ED - Abnormal; Notable for the following components:   Glucose-Capillary 371 (*)    All other components within normal limits    EKG None  Radiology Dg Chest Port 1 View  Result Date: 01/05/2019 CLINICAL DATA:  Shortness of breath.  COVID-19 positive EXAM: PORTABLE CHEST 1 VIEW COMPARISON:  December 28, 2018 FINDINGS: Lungs are clear. Heart size and pulmonary vascularity are normal. No adenopathy. No bone lesions. IMPRESSION: No edema or consolidation.  No evident adenopathy. Electronically Signed   By: Lowella Grip III M.D.   On: 01/05/2019 15:51    Procedures Procedures (including critical care time)  Medications Ordered in ED Medications  sodium chloride 0.9 % bolus 500 mL (0 mLs Intravenous Stopped 01/07/19 0520)      Initial Impression / Assessment and Plan / ED Course  I have reviewed the triage vital signs and the nursing notes.  Pertinent labs & imaging results that were available during my care of the patient were reviewed by me and considered in my medical decision making (see chart for details).        48 year old female with a history of COVID-19 with recent admission from November 20 through November 24 for acute respiratory failure secondary to COVID-19.  She had an A1c performed while she was inpatient that was elevated at 11.6.  She was discharged with a corticosteroid Dosepak for her Covid related symptoms, but it does not appear that her A1c was addressed while she was inpatient.  She was started on Metformin after she came back to the ER on 1128 and was also found to have thrush and hyperglycemia.  However, today she returns for dizziness and blurred vision in the setting of polyuria and polydipsia.  CBG is 528 on arrival.  Urinalysis with significant glucosuria.  She does also have a leukocytosis of 13.6, but I suspect this is secondary to corticosteroid use.  No evidence of DKA.  She was given a 500 cc fluid bolus in the ER and blood sugar was downtrending.  Following fluid bolus, dizziness and blurred vision had resolved.  She had a normal neurologic exam.  Strong suspicion that her symptoms were related to hyperglycemia.  I discussed with the patient that although she is on Metformin and should continue this medication, she needs close follow-up with primary care until her blood sugar is well controlled.  It does not appear that she received any education on diabetes mellitus while she was admitted.  She will likely need this in the outpatient setting as well.  She has not been given a glucometer so I will send her home with an Rx for the same as well as lancets and testing strips.  The patient was discussed with Dr. Clydell Hakim, attending physician.  She was given strict return precautions  to the ER.  She  is hemodynamically stable and in no acute distress.  Safe for discharge home with outpatient follow-up this time.  Final Clinical Impressions(s) / ED Diagnoses   Final diagnoses:  Hyperglycemia due to diabetes mellitus [facility])    ED Discharge Orders         Ordered    blood glucose meter kit and supplies KIT     01/07/19 0546           Joanne Gavel, PA-C 01/07/19 0525    Palumbo, April, MD 01/09/19 2354

## 2019-01-07 NOTE — ED Notes (Signed)
Received a call from [facility] ED stating pt was at their facility waiting to be seen.

## 2019-01-07 NOTE — Telephone Encounter (Signed)
TOC CM received message to arrange follow appt for pt, recent hospital stay on 12/28/2018, COVID + and newly diagnosed diabetes. Appt arranged at Primary Care at [facility] for 02/11/2018 at 2:30 pm. [facility] currently has no new hospital follow up appts. Will give a call to pt with appt schedule. EDP updated. Jonnie Finner RN CCM, [facility] ED TOC CM [phone]  TOC CM received follow up from [place], [facility] TOC CM and earlier appt arranged at [place] for 12/09 at 3 pm. Will update pt. Will cancel appt at Primary Care at [facility]. [facility], [place] ED TOC CM [phone]

## 2019-01-08 ENCOUNTER — Telehealth: Payer: Self-pay | Admitting: *Deleted

## 2019-01-16 ENCOUNTER — Ambulatory Visit (INDEPENDENT_AMBULATORY_CARE_PROVIDER_SITE_OTHER): Payer: Medicaid Other | Admitting: Primary Care

## 2019-01-16 ENCOUNTER — Encounter (INDEPENDENT_AMBULATORY_CARE_PROVIDER_SITE_OTHER): Payer: Self-pay | Admitting: Primary Care

## 2019-01-16 DIAGNOSIS — Z09 Encounter for follow-up examination after completed treatment for conditions other than malignant neoplasm: Secondary | ICD-10-CM

## 2019-01-16 DIAGNOSIS — Z7689 Persons encountering health services in other specified circumstances: Secondary | ICD-10-CM

## 2019-01-16 DIAGNOSIS — Z760 Encounter for issue of repeat prescription: Secondary | ICD-10-CM

## 2019-01-16 DIAGNOSIS — E119 Type 2 diabetes mellitus without complications: Secondary | ICD-10-CM

## 2019-01-16 MED ORDER — GLIPIZIDE 10 MG PO TABS: 10.0000 mg | ORAL_TABLET | Freq: Two times a day (BID) | ORAL | 3 refills | Status: DC

## 2019-01-16 MED ORDER — METFORMIN HCL 1000 MG PO TABS: 1000.0000 mg | ORAL_TABLET | Freq: Two times a day (BID) | ORAL | 3 refills | Status: DC

## 2019-01-16 NOTE — Progress Notes (Signed)
todays cbg  8:30 167 fasting  2:30 152

## 2019-02-06 NOTE — Progress Notes (Signed)
Virtual Visit via Telephone Note  I connected with [name] on 02/06/19 at  3:10 PM EST by telephone and verified that I am speaking with the correct person using two identifiers.   I discussed the limitations, risks, security and privacy concerns of performing an evaluation and management service by telephone and the availability of in person appointments. I also discussed with the patient that there may be a patient responsible charge related to this service. The patient expressed understanding and agreed to proceed.   History of Present Illness: Cynthia Parsons is having a tele visit for management of her diabetes. CBG fasting this AM 167 and 2:30 -152.   Past Medical History:  Diagnosis Date  . GERD (gastroesophageal reflux disease)   . Gout 09/28/2017  . Pneumonia    Current Outpatient Medications on File Prior to Visit  Medication Sig Dispense Refill  . blood glucose meter kit and supplies KIT Dispense based on patient and insurance preference. Use up to four times daily as directed. (FOR ICD-9 250.00, 250.01). 1 each 0  . Multiple Vitamin (MULTIVITAMIN WITH MINERALS) TABS tablet Take 1 tablet by mouth daily. 30 tablet 1  . acetaminophen (TYLENOL) 325 MG tablet Take 2 tablets (650 mg total) by mouth every 6 (six) hours as needed for fever. (Patient not taking: Reported on 01/16/2019) 30 tablet 0   No current facility-administered medications on file prior to visit.   Observations/Objective: Review of Systems  Genitourinary: Positive for frequency.  All other systems reviewed and are negative.   Assessment and Plan: Cynthia Parsons was seen today for hospitalization follow-up.  Diagnoses and all orders for this visit:  Type 2 diabetes mellitus without complication, without long-term current use of insulin (HCC) Goal for  6.5 hemoglobin A1c. 11.6 in hospital uncontrolled. Discussed foods that are high in carbohydrates are the following rice, potatoes, breads, sugars,  and pastas.  Reduction in the intake (eating) will assist in lowering your blood sugars. -     metFORMIN (GLUCOPHAGE) 1000 MG tablet; Take 1 tablet (1,000 mg total) by mouth 2 (two) times daily with a meal. -     glipiZIDE (GLUCOTROL) 10 MG tablet; Take 1 tablet (10 mg total) by mouth 2 (two) times daily before a meal.  Encounter to establish care [name], NP-C will be your  (PCP) mastered prepared that is able to that will  diagnosed and treatment able to answer health concern as well as continuing care of varied medical conditions, not limited by cause, organ system, or diagnosis.   Medication refill metFORMIN (GLUCOPHAGE) 1000 MG tablet; Take 1 tablet (1,000 mg total) by mouth 2 (two) times daily with a meal. -     glipiZIDE (GLUCOTROL) 10 MG tablet; Take 1 tablet (10 mg total) by mouth 2 (two) times daily before a meal.  Hospital discharge follow-up 01/07/2019 presented to the emergency room for  blurry vision, weakness, and urinary frequency that is all related to your newly diagnosed diabetes. Appointment to establish care made with [facility]   Follow Up Instructions:    I discussed the assessment and treatment plan with the patient. The patient was provided an opportunity to ask questions and all were answered. The patient agreed with the plan and demonstrated an understanding of the instructions.   The patient was advised to call back or seek an in-person evaluation if the symptoms worsen or if the condition fails to improve as anticipated.  I provided 28  minutes of non-face-to-face time during this encounter.  Included encounters, labs    Kerin Perna, NP

## 2019-02-12 ENCOUNTER — Ambulatory Visit: Payer: Medicaid Other

## 2019-02-18 ENCOUNTER — Telehealth (INDEPENDENT_AMBULATORY_CARE_PROVIDER_SITE_OTHER): Payer: Self-pay

## 2019-02-18 ENCOUNTER — Other Ambulatory Visit (INDEPENDENT_AMBULATORY_CARE_PROVIDER_SITE_OTHER): Payer: Self-pay | Admitting: Primary Care

## 2019-02-18 MED ORDER — GLUCOSE BLOOD VI STRP: ORAL_STRIP | 12 refills | Status: DC

## 2019-02-18 NOTE — Telephone Encounter (Signed)
Patient called to make a medication refill for test strips for test meter. [employer] is the name of her meter  Patient uses [id number] - [place], [place] - [address] AT [address] OF [address]   Patient also wanted to inform PCP that glipiZIDE (GLUCOTROL) 10 MG tablet  Is making her feel like she is out of control. It makes her feel like she is going to faint. Patient states PCP told her to take medication with metformin and she should take both medication with food. Patient states that when she takes glipizide her blood sugars drops to 61.  Please advice [phone] or [phone]  Thank you Lula Olszewski

## 2019-02-18 NOTE — Telephone Encounter (Signed)
FWD to PCP

## 2019-03-11 ENCOUNTER — Other Ambulatory Visit: Payer: Self-pay

## 2019-03-11 ENCOUNTER — Ambulatory Visit (HOSPITAL_COMMUNITY)
Admission: EM | Admit: 2019-03-11 | Discharge: 2019-03-11 | Disposition: A | Discharge status: Home / Self Care | Payer: Medicaid Other | Attending: Family Medicine | Admitting: Family Medicine

## 2019-03-11 ENCOUNTER — Encounter (HOSPITAL_COMMUNITY): Payer: Self-pay

## 2019-03-11 DIAGNOSIS — L298 Other pruritus: Secondary | ICD-10-CM

## 2019-03-11 DIAGNOSIS — T7840XA Allergy, unspecified, initial encounter: Secondary | ICD-10-CM

## 2019-03-11 MED ORDER — PREDNISONE 20 MG PO TABS: 20.0000 mg | ORAL_TABLET | Freq: Once | ORAL | Status: DC

## 2019-03-11 MED ORDER — PREDNISONE 20 MG PO TABS: 20.0000 mg | ORAL_TABLET | Freq: Two times a day (BID) | ORAL | 0 refills | Status: DC

## 2019-03-11 MED ORDER — PREDNISONE 20 MG PO TABS
ORAL_TABLET | ORAL | Status: AC
  Filled 2019-03-11: qty 1

## 2019-03-11 NOTE — ED Triage Notes (Signed)
Pt c/o rash, itching to scalp, neck, chest after having hair dyed approx 10 days. Taking benadryl with continued rash/swelling. Pt states felt as though throat was tightening but benadryl seemed to reduce the symptom. Denies lip swelling.

## 2019-03-11 NOTE — ED Provider Notes (Signed)
[name]    CSN: [id number] Arrival date & time: 03/11/19  1847      History   Chief Complaint Chief Complaint  Patient presents with  . Rash    HPI Cynthia Parsons is a 49 y.o. female.   HPI  Patient has known allergy to certain hair dyes.  And stylist used a new dye on her hair and unfortunately she is having a contact dermatitis vesicular crusting rash on her entire scalp that is terribly pruritic.  Is taking Benadryl.  Has not washed her hair.  She has been putting Vaseline on her scalp.  Past Medical History:  Diagnosis Date  . GERD (gastroesophageal reflux disease)   . Gout 09/28/2017  . Pneumonia     Patient Active Problem List   Diagnosis Date Noted  . Acute respiratory failure due to COVID-19 ([place]) 12/29/2018  . Diarrhea 12/29/2018  . Microcytic anemia 12/29/2018  . COVID-19 12/28/2018    Past Surgical History:  Procedure Laterality Date  . BREAST SURGERY    . CHOLECYSTECTOMY      OB History   No obstetric history on file.      Home Medications    Prior to Admission medications   Medication Sig Start Date End Date Taking? Authorizing Provider  diphenhydrAMINE (BENADRYL) 25 mg capsule Take 25 mg by mouth every 6 (six) hours as needed.   Yes [provider]  acetaminophen (TYLENOL) 325 MG tablet Take 2 tablets (650 mg total) by mouth every 6 (six) hours as needed for fever. Patient not taking: Reported on 01/16/2019 01/01/19   Deatra James, MD  blood glucose meter kit and supplies KIT Dispense based on patient and insurance preference. Use up to four times daily as directed. (FOR ICD-9 250.00, 250.01). 01/07/19   McDonald, Mia A, PA-C  glucose blood test strip Use as instructed 02/18/19   Kerin Perna, NP  metFORMIN (GLUCOPHAGE) 1000 MG tablet Take 1 tablet (1,000 mg total) by mouth 2 (two) times daily with a meal. 01/16/19   Kerin Perna, NP  Multiple Vitamin (MULTIVITAMIN WITH MINERALS) TABS tablet Take 1  tablet by mouth daily. 01/02/19   Shahmehdi, Valeria Batman, MD  predniSONE (DELTASONE) 20 MG tablet Take 1 tablet (20 mg total) by mouth 2 (two) times daily with a meal. 03/11/19   Raylene Everts, MD  glipiZIDE (GLUCOTROL) 10 MG tablet Take 1 tablet (10 mg total) by mouth 2 (two) times daily before a meal. 01/16/19 03/11/19  Kerin Perna, NP    Family History Family History  Problem Relation Age of Onset  . Hypertension Mother   . Diabetes Mother   . Stroke Mother   . Hypertension Father   . Diabetes Father     Social History Social History   Tobacco Use  . Smoking status: Never Smoker  . Smokeless tobacco: Never Used  Substance Use Topics  . Alcohol use: No  . Drug use: No     Allergies   Penicillins   Review of Systems Review of Systems  Skin: Positive for rash.     Physical Exam Triage Vital Signs ED Triage Vitals [03/11/19 1937]  Enc Vitals Group     BP 134/63     Pulse Rate (!) 112     Resp 18     Temp 97.9 F (36.6 C)     Temp Source Oral     SpO2 96 %     Weight  Height      Head Circumference      Peak Flow      Pain Score 4     Pain Loc      Pain Edu?      Excl. in [place]?    No data found.  Updated Vital Signs BP 134/63 (BP Location: Left Arm)   Pulse (!) 112   Temp 97.9 F (36.6 C) (Oral)   Resp 18   LMP 03/01/2019   SpO2 96%   Visual Acuity Right Eye Distance:   Left Eye Distance:   Bilateral Distance:    Right Eye Near:   Left Eye Near:    Bilateral Near:     Physical Exam Constitutional:      General: She is not in acute distress.    Appearance: She is well-developed.     Comments: Patient continually scratches her scalp  HENT:     Head: Normocephalic and atraumatic.     Comments: The scalp was covered with a fine vesicular rash with some crusting    Mouth/Throat:     Comments: Mask in place Eyes:     Conjunctiva/sclera: Conjunctivae normal.     Pupils: Pupils are equal, round, and reactive to light.    Cardiovascular:     Rate and Rhythm: Normal rate.  Pulmonary:     Effort: Pulmonary effort is normal. No respiratory distress.  Musculoskeletal:        General: Normal range of motion.     Cervical back: Normal range of motion.  Lymphadenopathy:     Cervical: No cervical adenopathy.  Skin:    General: Skin is warm and dry.  Neurological:     Mental Status: She is alert.  Psychiatric:        Mood and Affect: Mood normal.        Behavior: Behavior normal.      UC Treatments / Results  Labs (all labs ordered are listed, but only abnormal results are displayed) Labs Reviewed - No data to display  EKG   Radiology No results found.  Procedures Procedures (including critical care time)  Medications Ordered in UC Medications  predniSONE (DELTASONE) tablet 20 mg (20 mg Oral Not Given 03/11/19 2000)    Initial Impression / Assessment and Plan / UC Course  I have reviewed the triage vital signs and the nursing notes.  Pertinent labs & imaging results that were available during my care of the patient were reviewed by me and considered in my medical decision making (see chart for details).     stressed avoidance Final Clinical Impressions(s) / UC Diagnoses   Final diagnoses:  Allergic reaction, initial encounter     Discharge Instructions     Take Zyrtec 2 times a day This is a nondrowsy antihistamine.  It will help with the itching during the daytime. You may continue taking Benadryl at nighttime. Take prednisone 2 times a day for 5 days You may use a mild shampoo like baby shampoo You may use a scalp treatment like Scalpicin that has hydrocortisone in it   ED Prescriptions    Medication Sig Dispense Auth. Provider   predniSONE (DELTASONE) 20 MG tablet Take 1 tablet (20 mg total) by mouth 2 (two) times daily with a meal. 10 tablet Raylene Everts, MD     PDMP not reviewed this encounter.   Raylene Everts, MD 03/11/19 2017

## 2019-03-11 NOTE — Discharge Instructions (Signed)
Take Zyrtec 2 times a day This is a nondrowsy antihistamine.  It will help with the itching during the daytime. You may continue taking Benadryl at nighttime. Take prednisone 2 times a day for 5 days You may use a mild shampoo like baby shampoo You may use a scalp treatment like Scalpicin that has hydrocortisone in it

## 2019-03-13 ENCOUNTER — Telehealth (INDEPENDENT_AMBULATORY_CARE_PROVIDER_SITE_OTHER): Payer: Self-pay

## 2019-03-13 NOTE — Telephone Encounter (Signed)
Called patient to schedule fasting labs for lipids and CMP.  Was not able to get a hold of patient and left voice mail asking to call back to schedule an appointment.

## 2019-04-08 ENCOUNTER — Encounter (INDEPENDENT_AMBULATORY_CARE_PROVIDER_SITE_OTHER): Payer: Self-pay | Admitting: Primary Care

## 2019-04-08 ENCOUNTER — Ambulatory Visit (INDEPENDENT_AMBULATORY_CARE_PROVIDER_SITE_OTHER): Payer: Self-pay | Admitting: Primary Care

## 2019-04-08 ENCOUNTER — Other Ambulatory Visit: Payer: Self-pay

## 2019-04-08 VITALS — BP 137/82 | HR 80 | Temp 97.3°F | Ht 66.0 in | Wt 225.4 lb

## 2019-04-08 DIAGNOSIS — I10 Essential (primary) hypertension: Secondary | ICD-10-CM

## 2019-04-08 DIAGNOSIS — Z6836 Body mass index (BMI) 36.0-36.9, adult: Secondary | ICD-10-CM

## 2019-04-08 DIAGNOSIS — Z1231 Encounter for screening mammogram for malignant neoplasm of breast: Secondary | ICD-10-CM

## 2019-04-08 DIAGNOSIS — Z1211 Encounter for screening for malignant neoplasm of colon: Secondary | ICD-10-CM

## 2019-04-08 DIAGNOSIS — M1000 Idiopathic gout, unspecified site: Secondary | ICD-10-CM

## 2019-04-08 DIAGNOSIS — E119 Type 2 diabetes mellitus without complications: Secondary | ICD-10-CM

## 2019-04-08 LAB — POCT GLYCOSYLATED HEMOGLOBIN (HGB A1C): Hemoglobin A1C: 6 % — AB (ref 4.0–5.6)

## 2019-04-08 LAB — POCT CBG (FASTING - GLUCOSE)-MANUAL ENTRY: Glucose Fasting, POC: 112 mg/dL — AB (ref 70–99)

## 2019-04-08 MED ORDER — LISINOPRIL 5 MG PO TABS: 5.0000 mg | ORAL_TABLET | Freq: Every day | ORAL | 3 refills | Status: DC

## 2019-04-08 MED ORDER — METFORMIN HCL 500 MG PO TABS: 500.0000 mg | ORAL_TABLET | Freq: Two times a day (BID) | ORAL | 1 refills | Status: DC

## 2019-04-08 NOTE — Patient Instructions (Signed)
Gout is an inflammatory arthritis related to a hyperuricemia.  Risk factors greater than 40, female gender, increase in purines red meats and seafoods, alcohol smoking, and obesity greater than 30 Gout  Gout is painful swelling of your joints. Gout is a type of arthritis. It is caused by having too much uric acid in your body. Uric acid is a chemical that is made when your body breaks down substances called purines. If your body has too much uric acid, sharp crystals can form and build up in your joints. This causes pain and swelling. Gout attacks can happen quickly and be very painful (acute gout). Over time, the attacks can affect more joints and happen more often (chronic gout). What are the causes?  Too much uric acid in your blood. This can happen because: ? Your kidneys do not remove enough uric acid from your blood. ? Your body makes too much uric acid. ? You eat too many foods that are high in purines. These foods include organ meats, some seafood, and beer.  Trauma or stress. What increases the risk?  Having a family history of gout.  Being female and middle-aged.  Being female and having gone through menopause.  Being very overweight (obese).  Drinking alcohol, especially beer.  Not having enough water in the body (being dehydrated).  Losing weight too quickly.  Having an organ transplant.  Having lead poisoning.  Taking certain medicines.  Having kidney disease.  Having a skin condition called psoriasis. What are the signs or symptoms? An attack of acute gout usually happens in just one joint. The most common place is the big toe. Attacks often start at night. Other joints that may be affected include joints of the feet, ankle, knee, fingers, wrist, or elbow. Symptoms of an attack may include:  Very bad pain.  Warmth.  Swelling.  Stiffness.  Shiny, red, or purple skin.  Tenderness. The affected joint may be very painful to touch.  Chills and fever. Chronic  gout may cause symptoms more often. More joints may be involved. You may also have white or yellow lumps (tophi) on your hands or feet or in other areas near your joints. How is this treated?  Treatment for this condition has two phases: treating an acute attack and preventing future attacks.  Acute gout treatment may include: ? NSAIDs. ? Steroids. These are taken by mouth or injected into a joint. ? Colchicine. This medicine relieves pain and swelling. It can be given by mouth or through an IV tube.  Preventive treatment may include: ? Taking small doses of NSAIDs or colchicine daily. ? Using a medicine that reduces uric acid levels in your blood. ? Making changes to your diet. You may need to see a food expert (dietitian) about what to eat and drink to prevent gout. Follow these instructions at home: During a gout attack   If told, put ice on the painful area: ? Put ice in a plastic bag. ? Place a towel between your skin and the bag. ? Leave the ice on for 20 minutes, 2-3 times a day.  Raise (elevate) the painful joint above the level of your heart as often as you can.  Rest the joint as much as possible. If the joint is in your leg, you may be given crutches.  Follow instructions from your doctor about what you cannot eat or drink. Avoiding future gout attacks  Eat a low-purine diet. Avoid foods and drinks such as: ? Liver. ? Kidney. ?  Anchovies. ? Asparagus. ? Herring. ? Mushrooms. ? Mussels. ? Beer.  Stay at a healthy weight. If you want to lose weight, talk with your doctor. Do not lose weight too fast.  Start or continue an exercise plan as told by your doctor. Eating and drinking  Drink enough fluids to keep your pee (urine) pale yellow.  If you drink alcohol: ? Limit how much you use to:  0-1 drink a day for women.  0-2 drinks a day for men. ? Be aware of how much alcohol is in your drink. In the [place] one drink equals one 12 oz bottle of beer (355 mL),  one 5 oz glass of wine (148 mL), or one 1 oz glass of hard liquor (44 mL). General instructions  Take over-the-counter and prescription medicines only as told by your doctor.  Do not drive or use heavy machinery while taking prescription pain medicine.  Return to your normal activities as told by your doctor. Ask your doctor what activities are safe for you.  Keep all follow-up visits as told by your doctor. This is important. Contact a doctor if:  You have another gout attack.  You still have symptoms of a gout attack after 10 days of treatment.  You have problems (side effects) because of your medicines.  You have chills or a fever.  You have burning pain when you pee (urinate).  You have pain in your lower back or belly. Get help right away if:  You have very bad pain.  Your pain cannot be controlled.  You cannot pee. Summary  Gout is painful swelling of the joints.  The most common site of pain is the big toe, but it can affect other joints.  Medicines and avoiding some foods can help to prevent and treat gout attacks. This information is not intended to replace advice given to you by your health care provider. Make sure you discuss any questions you have with your health care provider. Document Revised: 08/16/2017 Document Reviewed: 08/16/2017 Elsevier Patient Education  2020 [employer].

## 2019-04-08 NOTE — Progress Notes (Signed)
Established Patient Office Visit  Subjective:  Patient ID: Cynthia Parsons, female    DOB: 03/23/1970  Age: 49 y.o. MRN: [id number]  CC:  Chief Complaint  Patient presents with  . Diabetes    HPI [name] presents for management of diabetes endorses taking diabetes medications prescribed denies s/s from hypo/hyper glycemia. Blood pressure slightly elevate will start low does or ARB, ACE. Denies shortness of breath, headaches and chest  pain . She does have lower extremity edema bilateral ankle left from a hx of a fracture and right gout  Past Medical History:  Diagnosis Date  . GERD (gastroesophageal reflux disease)   . Gout 09/28/2017  . Pneumonia     Past Surgical History:  Procedure Laterality Date  . BREAST SURGERY    . CHOLECYSTECTOMY      Family History  Problem Relation Age of Onset  . Hypertension Mother   . Diabetes Mother   . Stroke Mother   . Hypertension Father   . Diabetes Father     Social History   Socioeconomic History  . Marital status: Single    Spouse name: Not on file  . Number of children: Not on file  . Years of education: Not on file  . Highest education level: Not on file  Occupational History  . Not on file  Tobacco Use  . Smoking status: Never Smoker  . Smokeless tobacco: Never Used  Substance and Sexual Activity  . Alcohol use: No  . Drug use: No  . Sexual activity: Not on file  Other Topics Concern  . Not on file  Social History Narrative  . Not on file   Social Determinants of Health   Financial Resource Strain:   . Difficulty of Paying Living Expenses: Not on file  Food Insecurity:   . Worried About Charity fundraiser in the Last Year: Not on file  . Ran Out of Food in the Last Year: Not on file  Transportation Needs:   . Lack of Transportation (Medical): Not on file  . Lack of Transportation (Non-Medical): Not on file  Physical Activity:   . Days of Exercise per Week: Not on file  . Minutes of  Exercise per Session: Not on file  Stress:   . Feeling of Stress : Not on file  Social Connections:   . Frequency of Communication with Friends and Family: Not on file  . Frequency of Social Gatherings with Friends and Family: Not on file  . Attends Religious Services: Not on file  . Active Member of Clubs or Organizations: Not on file  . Attends Archivist Meetings: Not on file  . Marital Status: Not on file  Intimate Partner Violence:   . Fear of Current or Ex-Partner: Not on file  . Emotionally Abused: Not on file  . Physically Abused: Not on file  . Sexually Abused: Not on file    Outpatient Medications Prior to Visit  Medication Sig Dispense Refill  . blood glucose meter kit and supplies KIT Dispense based on patient and insurance preference. Use up to four times daily as directed. (FOR ICD-9 250.00, 250.01). 1 each 0  . diphenhydrAMINE (BENADRYL) 25 mg capsule Take 25 mg by mouth every 6 (six) hours as needed.    Marland Kitchen glucose blood test strip Use as instructed 100 each 12  . Multiple Vitamin (MULTIVITAMIN WITH MINERALS) TABS tablet Take 1 tablet by mouth daily. 30 tablet 1  . metFORMIN (GLUCOPHAGE) 1000 MG  tablet Take 1 tablet (1,000 mg total) by mouth 2 (two) times daily with a meal. 60 tablet 3  . acetaminophen (TYLENOL) 325 MG tablet Take 2 tablets (650 mg total) by mouth every 6 (six) hours as needed for fever. (Patient not taking: Reported on 01/16/2019) 30 tablet 0  . predniSONE (DELTASONE) 20 MG tablet Take 1 tablet (20 mg total) by mouth 2 (two) times daily with a meal. 10 tablet 0   No facility-administered medications prior to visit.    Allergies  Allergen Reactions  . Penicillins Hives    Did it involve swelling of the face/tongue/throat, SOB, or low BP? yes Did it involve sudden or severe rash/hives, skin peeling, or any reaction on the inside of your mouth or nose? yes Did you need to seek medical attention at a hospital or doctor's office? yes When did it  last happen?in her 17s If all above answers are "NO", may proceed with cephalosporin use.     ROS Review of Systems  Allergic/Immunologic: Positive for environmental allergies.  All other systems reviewed and are negative.     Objective:    Physical Exam  Constitutional: She is oriented to person, place, and time. She appears well-developed.  HENT:  Head: Normocephalic.  Eyes: Pupils are equal, round, and reactive to light. EOM are normal.  Cardiovascular: Normal rate and regular rhythm.  Pulmonary/Chest: Effort normal and breath sounds normal.  Abdominal: Bowel sounds are normal.  Musculoskeletal:        General: Normal range of motion.     Cervical back: Normal range of motion and neck supple.  Neurological: She is oriented to person, place, and time.  Skin: Skin is warm.  Psychiatric: She has a normal mood and affect. Her behavior is normal.    BP 137/82 (BP Location: Right Arm, Patient Position: Sitting, Cuff Size: Normal)   Pulse 80   Temp (!) 97.3 F (36.3 C) (Temporal)   Ht '5\' 6"'  (1.676 m)   Wt 225 lb 6.4 oz (102.2 kg)   LMP 04/02/2019 (Approximate)   SpO2 92%   BMI 36.38 kg/m  Wt Readings from Last 3 Encounters:  04/08/19 225 lb 6.4 oz (102.2 kg)  01/07/19 230 lb (104.3 kg)  12/28/18 234 lb (106.1 kg)     Health Maintenance Due  Topic Date Due  . URINE MICROALBUMIN  07/20/1978  . PAP SMEAR-Modifier  07/19/1989  . MAMMOGRAM  07/20/2018  . COLONOSCOPY  07/20/2018    There are no preventive care reminders to display for this patient.  No results found for: TSH Lab Results  Component Value Date   WBC 13.6 (H) 01/07/2019   HGB 14.6 01/07/2019   HCT 43.0 01/07/2019   MCV 67.5 (L) 01/07/2019   PLT 563 (H) 01/07/2019   Lab Results  Component Value Date   NA 133 (L) 01/07/2019   K 4.8 01/07/2019   CO2 24 01/05/2019   GLUCOSE 491 (H) 01/07/2019   BUN 16 01/07/2019   CREATININE 0.70 01/07/2019   BILITOT 0.3 01/01/2019   ALKPHOS 132 (H)  01/01/2019   AST 19 01/01/2019   ALT 19 01/01/2019   PROT 7.2 01/01/2019   ALBUMIN 3.3 (L) 01/01/2019   CALCIUM 9.7 01/05/2019   ANIONGAP 14 01/05/2019   No results found for: CHOL No results found for: HDL No results found for: [facility] Lab Results  Component Value Date   TRIG 82 12/28/2018   No results found for: [facility] Lab Results  Component Value Date  HGBA1C 6.0 (A) 04/08/2019      Assessment & Plan:  Melvia was seen today for diabetes.  Diagnoses and all orders for this visit:  Type 2 diabetes mellitus without complication, without long-term current use of insulin (HCC) Welled controlled no change in medication. Previously 3 months ago A1C 11.6 with medication, diet modification and exercise she is prediabetic. Reduced metformin to 540m bid. -     HgB A1c6.0  -     Glucose (CBG), Fasting -     Microalbumin, urine  Class 2 severe obesity due to excess calories with serious comorbidity in adult, unspecified BMI ([facility]Grant Obesity is 30-39 indicating an excess in caloric intake or underlining conditions. This may lead to other co-morbidities complications with diabetes, and respiration complications.  Lifestyle modifications of diet and exercise is may reduce obesity.   Encounter for screening mammogram for malignant neoplasm of breast Patient completed application for [facility] while in clinic and application has and [facility]ed to [facility].  Colon cancer screening Normal colon cancer screening.  CDC recommends colorectal screening from ages 543-75  .(USPSTF) -     Fecal occult blood, imunochemical; Future    Meds ordered this encounter  Medications  . metFORMIN (GLUCOPHAGE) 500 MG tablet    Sig: Take 1 tablet (500 mg total) by mouth 2 (two) times daily with a meal.    Dispense:  180 tablet    Refill:  1  . lisinopril (ZESTRIL) 5 MG tablet    Sig: Take 1 tablet (5 mg total) by mouth daily.    Dispense:   90 tablet    Refill:  3    Follow-up: Return for schedule pap.    MKerin Perna NP

## 2019-04-09 LAB — CBC WITH DIFFERENTIAL/PLATELET
Basophils Absolute: 0.1 10*3/uL (ref 0.0–0.2)
Basos: 1 %
EOS (ABSOLUTE): 0.2 10*3/uL (ref 0.0–0.4)
Eos: 2 %
Hematocrit: 33.9 % — ABNORMAL LOW (ref 34.0–46.6)
Hemoglobin: 9.8 g/dL — ABNORMAL LOW (ref 11.1–15.9)
Immature Grans (Abs): 0 10*3/uL (ref 0.0–0.1)
Immature Granulocytes: 0 %
Lymphocytes Absolute: 2.4 10*3/uL (ref 0.7–3.1)
Lymphs: 28 %
MCH: 19.6 pg — ABNORMAL LOW (ref 26.6–33.0)
MCHC: 28.9 g/dL — ABNORMAL LOW (ref 31.5–35.7)
MCV: 68 fL — ABNORMAL LOW (ref 79–97)
Monocytes Absolute: 0.6 10*3/uL (ref 0.1–0.9)
Monocytes: 7 %
Neutrophils Absolute: 5.3 10*3/uL (ref 1.4–7.0)
Neutrophils: 62 %
Platelets: 634 10*3/uL — ABNORMAL HIGH (ref 150–450)
RBC: 5.01 x10E6/uL (ref 3.77–5.28)
RDW: 18 % — ABNORMAL HIGH (ref 11.7–15.4)
WBC: 8.6 10*3/uL (ref 3.4–10.8)

## 2019-04-09 LAB — CMP14+EGFR
ALT: 17 IU/L (ref 0–32)
AST: 18 IU/L (ref 0–40)
Albumin/Globulin Ratio: 1.4 (ref 1.2–2.2)
Albumin: 4.2 g/dL (ref 3.8–4.8)
Alkaline Phosphatase: 93 IU/L (ref 39–117)
BUN/Creatinine Ratio: 9 (ref 9–23)
BUN: 7 mg/dL (ref 6–24)
Bilirubin Total: 0.2 mg/dL (ref 0.0–1.2)
CO2: 21 mmol/L (ref 20–29)
Calcium: 9.8 mg/dL (ref 8.7–10.2)
Chloride: 105 mmol/L (ref 96–106)
Creatinine, Ser: 0.79 mg/dL (ref 0.57–1.00)
GFR calc Af Amer: 101 mL/min/{1.73_m2} (ref >59)
GFR calc non Af Amer: 88 mL/min/{1.73_m2} (ref >59)
Globulin, Total: 3.1 g/dL (ref 1.5–4.5)
Glucose: 110 mg/dL — ABNORMAL HIGH (ref 65–99)
Potassium: 4.4 mmol/L (ref 3.5–5.2)
Sodium: 140 mmol/L (ref 134–144)
Total Protein: 7.3 g/dL (ref 6.0–8.5)

## 2019-04-09 LAB — LIPID PANEL
Chol/HDL Ratio: 2.4 ratio (ref 0.0–4.4)
Cholesterol, Total: 142 mg/dL (ref 100–199)
HDL: 58 mg/dL (ref >39)
LDL Chol Calc (NIH): 69 mg/dL (ref 0–99)
Triglycerides: 75 mg/dL (ref 0–149)
VLDL Cholesterol Cal: 15 mg/dL (ref 5–40)

## 2019-04-09 LAB — URIC ACID: Uric Acid: 4.7 mg/dL (ref 2.6–6.2)

## 2019-04-09 LAB — MICROALBUMIN, URINE: Microalbumin, Urine: 3.4 ug/mL

## 2019-04-10 ENCOUNTER — Other Ambulatory Visit (INDEPENDENT_AMBULATORY_CARE_PROVIDER_SITE_OTHER): Payer: Self-pay | Admitting: Primary Care

## 2019-04-10 MED ORDER — IRON (FERROUS SULFATE) 325 (65 FE) MG PO TABS: 325.0000 mg | ORAL_TABLET | ORAL | 1 refills | Status: DC

## 2019-04-10 MED ORDER — SENNA 8.6 MG PO TABS: 1.0000 | ORAL_TABLET | Freq: Every day | ORAL | 0 refills | Status: DC | PRN

## 2019-04-12 ENCOUNTER — Ambulatory Visit (INDEPENDENT_AMBULATORY_CARE_PROVIDER_SITE_OTHER): Payer: Medicaid Other | Admitting: Primary Care

## 2019-04-16 ENCOUNTER — Ambulatory Visit (INDEPENDENT_AMBULATORY_CARE_PROVIDER_SITE_OTHER): Payer: Medicaid Other | Admitting: Primary Care

## 2019-04-26 ENCOUNTER — Encounter (INDEPENDENT_AMBULATORY_CARE_PROVIDER_SITE_OTHER): Payer: Self-pay | Admitting: Family Medicine

## 2019-04-26 ENCOUNTER — Other Ambulatory Visit (HOSPITAL_COMMUNITY)
Admission: RE | Admit: 2019-04-26 | Discharge: 2019-04-26 | Disposition: A | Discharge status: Home / Self Care | Payer: Medicaid Other | Source: Ambulatory Visit | Attending: Family Medicine | Admitting: Family Medicine

## 2019-04-26 ENCOUNTER — Other Ambulatory Visit: Payer: Self-pay

## 2019-04-26 ENCOUNTER — Ambulatory Visit (INDEPENDENT_AMBULATORY_CARE_PROVIDER_SITE_OTHER): Payer: Self-pay | Admitting: Family Medicine

## 2019-04-26 VITALS — BP 119/75 | HR 86 | Temp 97.2°F | Ht 66.0 in | Wt 227.8 lb

## 2019-04-26 DIAGNOSIS — Z124 Encounter for screening for malignant neoplasm of cervix: Secondary | ICD-10-CM | POA: Insufficient documentation

## 2019-04-26 DIAGNOSIS — Z0000 Encounter for general adult medical examination without abnormal findings: Secondary | ICD-10-CM

## 2019-04-26 DIAGNOSIS — Z6836 Body mass index (BMI) 36.0-36.9, adult: Secondary | ICD-10-CM

## 2019-04-26 DIAGNOSIS — Z113 Encounter for screening for infections with a predominantly sexual mode of transmission: Secondary | ICD-10-CM | POA: Diagnosis present

## 2019-04-26 DIAGNOSIS — E6609 Other obesity due to excess calories: Secondary | ICD-10-CM

## 2019-04-26 DIAGNOSIS — Z1231 Encounter for screening mammogram for malignant neoplasm of breast: Secondary | ICD-10-CM

## 2019-04-26 DIAGNOSIS — Z1211 Encounter for screening for malignant neoplasm of colon: Secondary | ICD-10-CM

## 2019-04-26 NOTE — Progress Notes (Signed)
Subjective:  Patient ID: Cynthia Parsons, female    DOB: 07-26-1970  Age: 49 y.o. MRN: [id number]  CC: Gynecologic Exam   HPI Cynthia Parsons presents for an annual physical exam. She is due for a PAP smear and mammogram. Mammogram order was placed at her last visit however she is yet to have this done.  Past Medical History:  Diagnosis Date  . GERD (gastroesophageal reflux disease)   . Gout 09/28/2017  . Pneumonia     Past Surgical History:  Procedure Laterality Date  . BREAST SURGERY    . CHOLECYSTECTOMY      Family History  Problem Relation Age of Onset  . Hypertension Mother   . Diabetes Mother   . Stroke Mother   . Hypertension Father   . Diabetes Father     Allergies  Allergen Reactions  . Penicillins Hives    Did it involve swelling of the face/tongue/throat, SOB, or low BP? yes Did it involve sudden or severe rash/hives, skin peeling, or any reaction on the inside of your mouth or nose? yes Did you need to seek medical attention at a hospital or doctor's office? yes When did it last happen?in her 63s If all above answers are "NO", may proceed with cephalosporin use.     Outpatient Medications Prior to Visit  Medication Sig Dispense Refill  . blood glucose meter kit and supplies KIT Dispense based on patient and insurance preference. Use up to four times daily as directed. (FOR ICD-9 250.00, 250.01). 1 each 0  . diphenhydrAMINE (BENADRYL) 25 mg capsule Take 25 mg by mouth every 6 (six) hours as needed.    Marland Kitchen glucose blood test strip Use as instructed 100 each 12  . lisinopril (ZESTRIL) 5 MG tablet Take 1 tablet (5 mg total) by mouth daily. 90 tablet 3  . metFORMIN (GLUCOPHAGE) 500 MG tablet Take 1 tablet (500 mg total) by mouth 2 (two) times daily with a meal. 180 tablet 1  . Multiple Vitamin (MULTIVITAMIN WITH MINERALS) TABS tablet Take 1 tablet by mouth daily. 30 tablet 1  . senna (SENOKOT) 8.6 MG TABS tablet Take 1 tablet (8.6 mg total) by mouth  daily as needed for mild constipation. 120 tablet 0  . acetaminophen (TYLENOL) 325 MG tablet Take 2 tablets (650 mg total) by mouth every 6 (six) hours as needed for fever. (Patient not taking: Reported on 04/26/2019) 30 tablet 0  . Iron, Ferrous Sulfate, 325 (65 Fe) MG TABS Take 325 mg by mouth 1 day or 1 dose for 1 dose. 90 tablet 1   No facility-administered medications prior to visit.     ROS Review of Systems  Constitutional: Negative for activity change, appetite change and fatigue.  HENT: Negative for congestion, sinus pressure and sore throat.   Eyes: Negative for visual disturbance.  Respiratory: Negative for cough, chest tightness, shortness of breath and wheezing.   Cardiovascular: Negative for chest pain and palpitations.  Gastrointestinal: Negative for abdominal distention, abdominal pain and constipation.  Endocrine: Negative for polydipsia.  Genitourinary: Negative for dysuria and frequency.  Musculoskeletal: Negative for arthralgias and back pain.  Skin: Negative for rash.  Neurological: Negative for tremors, light-headedness and numbness.  Hematological: Does not bruise/bleed easily.  Psychiatric/Behavioral: Negative for agitation and behavioral problems.    Objective:  BP 119/75 (BP Location: Right Arm, Patient Position: Sitting, Cuff Size: Normal)   Pulse 86   Temp (!) 97.2 F (36.2 C) (Temporal)   Ht _0  (1.676 m)  Wt 227 lb 12.8 oz (103.3 kg)   LMP 04/02/2019 (Approximate)   SpO2 99%   BMI 36.77 kg/m   BP/Weight 04/26/2019 08/08/5364 05/11/345  Systolic BP 425 956 387  Diastolic BP 75 82 63  Wt. (Lbs) 227.8 225.4 -  BMI 36.77 36.38 -      Physical Exam Constitutional:      General: She is not in acute distress.    Appearance: She is well-developed. She is not diaphoretic.  HENT:     Head: Normocephalic.     Right Ear: External ear normal.     Left Ear: External ear normal.     Nose: Nose normal.  Eyes:     Conjunctiva/sclera: Conjunctivae  normal.     Pupils: Pupils are equal, round, and reactive to light.  Neck:     Vascular: No JVD.  Cardiovascular:     Rate and Rhythm: Normal rate and regular rhythm.     Heart sounds: Normal heart sounds. No murmur. No gallop.   Pulmonary:     Effort: Pulmonary effort is normal. No respiratory distress.     Breath sounds: Normal breath sounds. No wheezing or rales.  Chest:     Chest wall: No tenderness.     Breasts:        Right: No mass (healed surgical scar from breast reduction) or tenderness.        Left: No mass (healed surgical scar from breast reduction) or tenderness.  Abdominal:     General: Bowel sounds are normal. There is no distension.     Palpations: Abdomen is soft. There is no mass.     Tenderness: There is no abdominal tenderness.  Genitourinary:    Comments: External genitalia, vagina, cervix, adnexa normal Musculoskeletal:        General: No tenderness. Normal range of motion.     Cervical back: Normal range of motion.  Skin:    General: Skin is warm and dry.  Neurological:     Mental Status: She is alert and oriented to person, place, and time.     Deep Tendon Reflexes: Reflexes are normal and symmetric.     CMP Latest Ref Rng & Units 04/08/2019 01/07/2019 01/05/2019  Glucose 65 - 99 mg/dL 110(H) 491(H) 574(HH)  BUN 6 - 24 mg/dL 7 16 21(H)  Creatinine 0.57 - 1.00 mg/dL 0.79 0.70 1.11(H)  Sodium 134 - 144 mmol/L 140 133(L) 132(L)  Potassium 3.5 - 5.2 mmol/L 4.4 4.8 4.4  Chloride 96 - 106 mmol/L 105 97(L) 94(L)  CO2 20 - 29 mmol/L 21 - 24  Calcium 8.7 - 10.2 mg/dL 9.8 - 9.7  Total Protein 6.0 - 8.5 g/dL 7.3 - -  Total Bilirubin 0.0 - 1.2 mg/dL 0.2 - -  Alkaline Phos 39 - 117 IU/L 93 - -  AST 0 - 40 IU/L 18 - -  ALT 0 - 32 IU/L 17 - -    Lipid Panel     Component Value Date/Time   CHOL 142 04/08/2019 0926   TRIG 75 04/08/2019 0926   HDL 58 04/08/2019 0926   CHOLHDL 2.4 04/08/2019 0926   LDLCALC 69 04/08/2019 0926    CBC    Component Value  Date/Time   WBC 8.6 04/08/2019 0926   WBC 13.6 (H) 01/07/2019 0257   RBC 5.01 04/08/2019 0926   RBC 6.07 (H) 01/07/2019 0257   HGB 9.8 (L) 04/08/2019 0926   HCT 33.9 (L) 04/08/2019 0926   PLT 634 (H) 04/08/2019 5643  MCV 68 (L) 04/08/2019 0926   MCH 19.6 (L) 04/08/2019 0926   MCH 18.8 (L) 01/07/2019 0257   MCHC 28.9 (L) 04/08/2019 0926   MCHC 27.8 (L) 01/07/2019 0257   RDW 18.0 (H) 04/08/2019 0926   LYMPHSABS 2.4 04/08/2019 0926   MONOABS 0.7 01/07/2019 0257   EOSABS 0.2 04/08/2019 0926   BASOSABS 0.1 04/08/2019 0926    Lab Results  Component Value Date   HGBA1C 6.0 (A) 04/08/2019    Assessment & Plan:  1. Annual physical exam Counseled on 150 minutes of exercise per week, healthy eating (including decreased daily intake of saturated fats, cholesterol, added sugars, sodium), STI prevention, routine healthcare maintenance.   2. Encounter for screening mammogram for malignant neoplasm of breast Provided information to scheduled mammogram via [facility] given Family planning Medicaid  3. Screening for colon cancer - Ambulatory referral to Gastroenterology  4. Screening for cervical cancer - Cytology - PAP([facility])  5. Screening for STD (sexually transmitted disease) - Cervicovaginal ancillary only  6. Class 2 obesity due to excess calories without serious comorbidity with body mass index (BMI) of 36.0 to 36.9 in adult Discussed reducing portion sizes, increasing physical activity, avoiding late meals    No orders of the defined types were placed in this encounter.   Follow-up: Return in about 6 months (around 10/27/2019) for chronic disease management.       Charlott Rakes, MD, FAAFP. [facility], [place]   04/26/2019, 10:02 AM

## 2019-04-26 NOTE — Patient Instructions (Signed)
Health Maintenance, Female Adopting a healthy lifestyle and getting preventive care are important in promoting health and wellness. Ask your health care provider about:  The right schedule for you to have regular tests and exams.  Things you can do on your own to prevent diseases and keep yourself healthy. What should I know about diet, weight, and exercise? Eat a healthy diet   Eat a diet that includes plenty of vegetables, fruits, low-fat dairy products, and lean protein.  Do not eat a lot of foods that are high in solid fats, added sugars, or sodium. Maintain a healthy weight Body mass index (BMI) is used to identify weight problems. It estimates body fat based on height and weight. Your health care provider can help determine your BMI and help you achieve or maintain a healthy weight. Get regular exercise Get regular exercise. This is one of the most important things you can do for your health. Most adults should:  Exercise for at least 150 minutes each week. The exercise should increase your heart rate and make you sweat (moderate-intensity exercise).  Do strengthening exercises at least twice a week. This is in addition to the moderate-intensity exercise.  Spend less time sitting. Even light physical activity can be beneficial. Watch cholesterol and blood lipids Have your blood tested for lipids and cholesterol at 51 years of age, then have this test every 5 years. Have your cholesterol levels checked more often if:  Your lipid or cholesterol levels are high.  You are older than 51 years of age.  You are at high risk for heart disease. What should I know about cancer screening? Depending on your health history and family history, you may need to have cancer screening at various ages. This may include screening for:  Breast cancer.  Cervical cancer.  Colorectal cancer.  Skin cancer.  Lung cancer. What should I know about heart disease, diabetes, and high blood  pressure? Blood pressure and heart disease  High blood pressure causes heart disease and increases the risk of stroke. This is more likely to develop in people who have high blood pressure readings, are of African descent, or are overweight.  Have your blood pressure checked: ? Every 3-5 years if you are 18-39 years of age. ? Every year if you are 40 years old or older. Diabetes Have regular diabetes screenings. This checks your fasting blood sugar level. Have the screening done:  Once every three years after age 40 if you are at a normal weight and have a low risk for diabetes.  More often and at a younger age if you are overweight or have a high risk for diabetes. What should I know about preventing infection? Hepatitis B If you have a higher risk for hepatitis B, you should be screened for this virus. Talk with your health care provider to find out if you are at risk for hepatitis B infection. Hepatitis C Testing is recommended for:  Everyone born from 1945 through 1965.  Anyone with known risk factors for hepatitis C. Sexually transmitted infections (STIs)  Get screened for STIs, including gonorrhea and chlamydia, if: ? You are sexually active and are younger than 51 years of age. ? You are older than 51 years of age and your health care provider tells you that you are at risk for this type of infection. ? Your sexual activity has changed since you were last screened, and you are at increased risk for chlamydia or gonorrhea. Ask your health care provider if   you are at risk.  Ask your health care provider about whether you are at high risk for HIV. Your health care provider may recommend a prescription medicine to help prevent HIV infection. If you choose to take medicine to prevent HIV, you should first get tested for HIV. You should then be tested every 3 months for as long as you are taking the medicine. Pregnancy  If you are about to stop having your period (premenopausal) and  you may become pregnant, seek counseling before you get pregnant.  Take 400 to 800 micrograms (mcg) of folic acid every day if you become pregnant.  Ask for birth control (contraception) if you want to prevent pregnancy. Osteoporosis and menopause Osteoporosis is a disease in which the bones lose minerals and strength with aging. This can result in bone fractures. If you are 65 years old or older, or if you are at risk for osteoporosis and fractures, ask your health care provider if you should:  Be screened for bone loss.  Take a calcium or vitamin D supplement to lower your risk of fractures.  Be given hormone replacement therapy (HRT) to treat symptoms of menopause. Follow these instructions at home: Lifestyle  Do not use any products that contain nicotine or tobacco, such as cigarettes, e-cigarettes, and chewing tobacco. If you need help quitting, ask your health care provider.  Do not use street drugs.  Do not share needles.  Ask your health care provider for help if you need support or information about quitting drugs. Alcohol use  Do not drink alcohol if: ? Your health care provider tells you not to drink. ? You are pregnant, may be pregnant, or are planning to become pregnant.  If you drink alcohol: ? Limit how much you use to 0-1 drink a day. ? Limit intake if you are breastfeeding.  Be aware of how much alcohol is in your drink. In the [place] one drink equals one 12 oz bottle of beer (355 mL), one 5 oz glass of wine (148 mL), or one 1 oz glass of hard liquor (44 mL). General instructions  Schedule regular health, dental, and eye exams.  Stay current with your vaccines.  Tell your health care provider if: ? You often feel depressed. ? You have ever been abused or do not feel safe at home. Summary  Adopting a healthy lifestyle and getting preventive care are important in promoting health and wellness.  Follow your health care provider's instructions about healthy  diet, exercising, and getting tested or screened for diseases.  Follow your health care provider's instructions on monitoring your cholesterol and blood pressure. This information is not intended to replace advice given to you by your health care provider. Make sure you discuss any questions you have with your health care provider. Document Revised: 01/17/2018 Document Reviewed: 01/17/2018 Elsevier Patient Education  2020 [employer] Inc.  

## 2019-04-26 NOTE — Progress Notes (Signed)
227.8 97.2 

## 2019-04-30 LAB — CERVICOVAGINAL ANCILLARY ONLY
Bacterial Vaginitis (gardnerella): POSITIVE — AB
Candida Glabrata: NEGATIVE
Candida Vaginitis: POSITIVE — AB
Chlamydia: NEGATIVE
Comment: NEGATIVE
Comment: NEGATIVE
Comment: NEGATIVE
Comment: NEGATIVE
Comment: NEGATIVE
Comment: NORMAL
Neisseria Gonorrhea: NEGATIVE
Trichomonas: NEGATIVE

## 2019-05-01 ENCOUNTER — Other Ambulatory Visit: Payer: Self-pay | Admitting: Family Medicine

## 2019-05-01 ENCOUNTER — Encounter: Payer: Self-pay | Admitting: Family Medicine

## 2019-05-01 LAB — CYTOLOGY - PAP
Comment: NEGATIVE
Diagnosis: NEGATIVE
High risk HPV: NEGATIVE

## 2019-05-01 MED ORDER — FLUCONAZOLE 150 MG PO TABS: 150.0000 mg | ORAL_TABLET | Freq: Once | ORAL | 0 refills | Status: AC

## 2019-05-01 MED ORDER — METRONIDAZOLE 0.75 % VA GEL: 1.0000 | Freq: Every day | VAGINAL | 0 refills | Status: DC

## 2019-05-04 ENCOUNTER — Other Ambulatory Visit (INDEPENDENT_AMBULATORY_CARE_PROVIDER_SITE_OTHER): Payer: Self-pay | Admitting: Primary Care

## 2019-05-31 ENCOUNTER — Telehealth (INDEPENDENT_AMBULATORY_CARE_PROVIDER_SITE_OTHER): Payer: Self-pay

## 2019-05-31 NOTE — Telephone Encounter (Signed)
Open in error

## 2019-05-31 NOTE — Telephone Encounter (Signed)
Patient is struggling with indigestion and acid reflux she would like Rx for. States it has been keeping her up all night. Please send to patient pharmacy.

## 2019-05-31 NOTE — Telephone Encounter (Signed)
Patient called requesting medication reflux. States it gets worse during the night.  Patient uses [id number] - [place], [place] - [address]   Please advice patient [phone]

## 2019-06-01 ENCOUNTER — Other Ambulatory Visit: Payer: Self-pay | Admitting: Primary Care

## 2019-06-01 MED ORDER — OMEPRAZOLE 20 MG PO CPDR: 20.0000 mg | DELAYED_RELEASE_CAPSULE | Freq: Every day | ORAL | 3 refills | Status: DC

## 2019-06-05 ENCOUNTER — Encounter: Payer: Self-pay | Admitting: Family Medicine

## 2019-12-11 ENCOUNTER — Encounter (INDEPENDENT_AMBULATORY_CARE_PROVIDER_SITE_OTHER): Payer: Self-pay | Admitting: Primary Care

## 2019-12-11 ENCOUNTER — Other Ambulatory Visit: Payer: Self-pay

## 2019-12-11 ENCOUNTER — Ambulatory Visit (INDEPENDENT_AMBULATORY_CARE_PROVIDER_SITE_OTHER): Payer: Self-pay | Admitting: Primary Care

## 2019-12-11 VITALS — BP 144/84 | HR 90 | Temp 97.5°F | Ht 66.0 in | Wt 244.2 lb

## 2019-12-11 DIAGNOSIS — R030 Elevated blood-pressure reading, without diagnosis of hypertension: Secondary | ICD-10-CM

## 2019-12-11 DIAGNOSIS — Z1211 Encounter for screening for malignant neoplasm of colon: Secondary | ICD-10-CM

## 2019-12-11 DIAGNOSIS — E119 Type 2 diabetes mellitus without complications: Secondary | ICD-10-CM

## 2019-12-11 DIAGNOSIS — M545 Low back pain, unspecified: Secondary | ICD-10-CM

## 2019-12-11 DIAGNOSIS — D509 Iron deficiency anemia, unspecified: Secondary | ICD-10-CM

## 2019-12-11 LAB — POCT GLYCOSYLATED HEMOGLOBIN (HGB A1C): Hemoglobin A1C: 7.9 % — AB (ref 4.0–5.6)

## 2019-12-11 LAB — GLUCOSE, POCT (MANUAL RESULT ENTRY): POC Glucose: 268 mg/dl — AB (ref 70–99)

## 2019-12-11 MED ORDER — METFORMIN HCL 1000 MG PO TABS: 1000.0000 mg | ORAL_TABLET | Freq: Two times a day (BID) | ORAL | 1 refills | Status: DC

## 2019-12-11 MED ORDER — METHOCARBAMOL 500 MG PO TABS: 500.0000 mg | ORAL_TABLET | Freq: Three times a day (TID) | ORAL | 1 refills | Status: DC | PRN

## 2019-12-11 NOTE — Patient Instructions (Signed)

## 2019-12-11 NOTE — Progress Notes (Signed)
Established Patient Office Visit  Subjective:  Patient ID: Cynthia Parsons, female    DOB: January 23, 1971  Age: 49 y.o. MRN: [id number]  CC:  Chief Complaint  Patient presents with  . Back Pain    believes Cynthia Parsons pulled a muscle     HPI Cynthia Parsons is a 49 year old obese female presents for back pain Cynthia Parsons went to pick up a jug at work and heard a pop. Advise to tell her supervisor - the jug was filled with liquid and was too heavy Cynthia Parsons found out after the pop and discomfort since Saturday 5 days.4-10/10   Past Medical History:  Diagnosis Date  . GERD (gastroesophageal reflux disease)   . Gout 09/28/2017  . Pneumonia     Past Surgical History:  Procedure Laterality Date  . BREAST SURGERY    . CHOLECYSTECTOMY      Family History  Problem Relation Age of Onset  . Hypertension Mother   . Diabetes Mother   . Stroke Mother   . Hypertension Father   . Diabetes Father     Social History   Socioeconomic History  . Marital status: Single    Spouse name: Not on file  . Number of children: Not on file  . Years of education: Not on file  . Highest education level: Not on file  Occupational History  . Not on file  Tobacco Use  . Smoking status: Never Smoker  . Smokeless tobacco: Never Used  Vaping Use  . Vaping Use: Never used  Substance and Sexual Activity  . Alcohol use: No  . Drug use: No  . Sexual activity: Not on file  Other Topics Concern  . Not on file  Social History Narrative  . Not on file   Social Determinants of Health   Financial Resource Strain:   . Difficulty of Paying Living Expenses: Not on file  Food Insecurity:   . Worried About Charity fundraiser in the Last Year: Not on file  . Ran Out of Food in the Last Year: Not on file  Transportation Needs:   . Lack of Transportation (Medical): Not on file  . Lack of Transportation (Non-Medical): Not on file  Physical Activity:   . Days of Exercise per Week: Not on file  . Minutes of  Exercise per Session: Not on file  Stress:   . Feeling of Stress : Not on file  Social Connections:   . Frequency of Communication with Friends and Family: Not on file  . Frequency of Social Gatherings with Friends and Family: Not on file  . Attends Religious Services: Not on file  . Active Member of Clubs or Organizations: Not on file  . Attends Archivist Meetings: Not on file  . Marital Status: Not on file  Intimate Partner Violence:   . Fear of Current or Ex-Partner: Not on file  . Emotionally Abused: Not on file  . Physically Abused: Not on file  . Sexually Abused: Not on file    Outpatient Medications Prior to Visit  Medication Sig Dispense Refill  . blood glucose meter kit and supplies KIT Dispense based on patient and insurance preference. Use up to four times daily as directed. (FOR ICD-9 250.00, 250.01). 1 each 0  . glucose blood test strip Use as instructed 100 each 12  . diphenhydrAMINE (BENADRYL) 25 mg capsule Take 25 mg by mouth every 6 (six) hours as needed. (Patient not taking: Reported on 12/11/2019)    .  Iron, Ferrous Sulfate, 325 (65 Fe) MG TABS Take 325 mg by mouth 1 day or 1 dose for 1 dose. 90 tablet 1  . lisinopril (ZESTRIL) 5 MG tablet Take 1 tablet (5 mg total) by mouth daily. (Patient not taking: Reported on 12/11/2019) 90 tablet 3  . Multiple Vitamin (MULTIVITAMIN WITH MINERALS) TABS tablet Take 1 tablet by mouth daily. (Patient not taking: Reported on 12/11/2019) 30 tablet 1  . omeprazole (PRILOSEC) 20 MG capsule Take 1 capsule (20 mg total) by mouth daily. (Patient not taking: Reported on 12/11/2019) 30 capsule 3  . senna (SENOKOT) 8.6 MG TABS tablet Take 1 tablet (8.6 mg total) by mouth daily as needed for mild constipation. (Patient not taking: Reported on 12/11/2019) 120 tablet 0  . acetaminophen (TYLENOL) 325 MG tablet Take 2 tablets (650 mg total) by mouth every 6 (six) hours as needed for fever. (Patient not taking: Reported on 04/26/2019) 30 tablet 0   . metFORMIN (GLUCOPHAGE) 500 MG tablet Take 1 tablet (500 mg total) by mouth 2 (two) times daily with a meal. (Patient not taking: Reported on 12/11/2019) 180 tablet 1  . metroNIDAZOLE (METROGEL VAGINAL) 0.75 % vaginal gel Place 1 Applicatorful vaginally at bedtime. 70 g 0   No facility-administered medications prior to visit.    Allergies  Allergen Reactions  . Penicillins Hives    Did it involve swelling of the face/tongue/throat, SOB, or low BP? yes Did it involve sudden or severe rash/hives, skin peeling, or any reaction on the inside of your mouth or nose? yes Did you need to seek medical attention at a hospital or doctor's office? yes When did it last happen?in her 32s If all above answers are "NO", may proceed with cephalosporin use.     ROS Review of Systems  Musculoskeletal: Positive for back pain and neck stiffness.  All other systems reviewed and are negative.     Objective:    Physical Exam Vitals reviewed.  Constitutional:      Appearance: Cynthia Parsons is obese.  HENT:     Head: Normocephalic.     Right Ear: Tympanic membrane normal.     Left Ear: Tympanic membrane normal.     Nose: Nose normal.  Eyes:     Extraocular Movements: Extraocular movements intact.     Pupils: Pupils are equal, round, and reactive to light.  Cardiovascular:     Rate and Rhythm: Normal rate and regular rhythm.  Pulmonary:     Effort: Pulmonary effort is normal.     Breath sounds: Normal breath sounds.  Abdominal:     General: Bowel sounds are normal. There is distension.     Palpations: Abdomen is soft.  Musculoskeletal:     Cervical back: Normal range of motion and neck supple.     Comments: Decrease ROM upper extremities 50 degrees before pain  Skin:    General: Skin is warm and dry.  Neurological:     Mental Status: Cynthia Parsons is alert and oriented to person, place, and time.  Psychiatric:        Mood and Affect: Mood normal.        Behavior: Behavior normal.        Thought Content:  Thought content normal.        Judgment: Judgment normal.     BP (!) 144/84 (BP Location: Right Arm, Patient Position: Sitting, Cuff Size: Large)   Pulse 90   Temp (!) 97.5 F (36.4 C) (Temporal)   Ht _0  (1.676 m)  Wt 244 lb 3.2 oz (110.8 kg)   LMP 11/30/2019 (Exact Date)   SpO2 97%   BMI 39.41 kg/m  Wt Readings from Last 3 Encounters:  12/11/19 244 lb 3.2 oz (110.8 kg)  04/26/19 227 lb 12.8 oz (103.3 kg)  04/08/19 225 lb 6.4 oz (102.2 kg)     Health Maintenance Due  Topic Date Due  . Hepatitis C Screening  Never done  . COVID-19 Vaccine (1) Never done  . MAMMOGRAM  Never done  . COLONOSCOPY  Never done    There are no preventive care reminders to display for this patient.  No results found for: TSH Lab Results  Component Value Date   WBC 8.6 04/08/2019   HGB 9.8 (L) 04/08/2019   HCT 33.9 (L) 04/08/2019   MCV 68 (L) 04/08/2019   PLT 634 (H) 04/08/2019   Lab Results  Component Value Date   NA 140 04/08/2019   K 4.4 04/08/2019   CO2 21 04/08/2019   GLUCOSE 110 (H) 04/08/2019   BUN 7 04/08/2019   CREATININE 0.79 04/08/2019   BILITOT 0.2 04/08/2019   ALKPHOS 93 04/08/2019   AST 18 04/08/2019   ALT 17 04/08/2019   PROT 7.3 04/08/2019   ALBUMIN 4.2 04/08/2019   CALCIUM 9.8 04/08/2019   ANIONGAP 14 01/05/2019   Lab Results  Component Value Date   CHOL 142 04/08/2019   Lab Results  Component Value Date   HDL 58 04/08/2019   Lab Results  Component Value Date   LDLCALC 69 04/08/2019   Lab Results  Component Value Date   TRIG 75 04/08/2019   Lab Results  Component Value Date   CHOLHDL 2.4 04/08/2019   Lab Results  Component Value Date   HGBA1C 7.9 (A) 12/11/2019      Assessment & Plan:  Cynthia Parsons was seen today for back pain.  Diagnoses and all orders for this visit:  Type 2 diabetes mellitus without complication, without long-term current use of insulin (HCC) A1C increased stopped medication due to would follow lifestyle modification  and did not monitor intake. -     HgB A1c -     Glucose (CBG) -     metFORMIN (GLUCOPHAGE) 1000 MG tablet; Take 1 tablet (1,000 mg total) by mouth 2 (two) times daily with a meal. -     CBC with Differential; Future -     Lipid panel; Future  Special screening for malignant neoplasms, colon -     Cancel: Fecal occult blood, imunochemical([facility]) -     Fecal occult blood, imunochemical([facility]); Future  Acute bilateral low back pain without sciatica BACK PAIN  Location: lumbar/sacral Quality: cramping and pressure Onset: gradual Worse with: sitting       Better with: heating pad  Radiation: no Trauma: yes Best sitting/standing/leaning forward: yes  Red Flags Fecal/urinary incontinence: no  Numbness/Weakness: no  Fever/chills/sweats: no  Night pain: yes  Unexplained weight loss: no  No relief with bedrest: yes  h/o cancer/immunosuppression: no  IV drug use: no  PMH of osteoporosis or chronic steroid use: no  -     methocarbamol (ROBAXIN) 500 MG tablet; Take 1 tablet (500 mg total) by mouth every 8 (eight) hours as needed for muscle spasms.  Iron deficiency anemia, unspecified iron deficiency anemia type -     CBC with Differential; Future  Elevated blood pressure reading without diagnosis of hypertension Counseled on blood pressure goal of less than 130/80, low-sodium, DASH diet, medication compliance, 150 minutes of moderate  intensity exercise per week. Can be contributed to pain     Meds ordered this encounter  Medications  . metFORMIN (GLUCOPHAGE) 1000 MG tablet    Sig: Take 1 tablet (1,000 mg total) by mouth 2 (two) times daily with a meal.    Dispense:  180 tablet    Refill:  1  . methocarbamol (ROBAXIN) 500 MG tablet    Sig: Take 1 tablet (500 mg total) by mouth every 8 (eight) hours as needed for muscle spasms.    Dispense:  90 tablet    Refill:  1    Follow-up: Return in about 3 months (around 03/12/2020) for fasting labs/ f/u DM and Bp ck.     Kerin Perna, NP

## 2019-12-11 NOTE — Progress Notes (Signed)
Pt complains of back pain believes she pulled a muscle while bending to lift a container  Pain increases throughout the day She feels less pain when standing

## 2020-06-23 ENCOUNTER — Encounter: Payer: Self-pay | Admitting: Emergency Medicine

## 2020-06-23 ENCOUNTER — Other Ambulatory Visit: Payer: Self-pay

## 2020-06-23 DIAGNOSIS — Z5321 Procedure and treatment not carried out due to patient leaving prior to being seen by health care provider: Secondary | ICD-10-CM | POA: Insufficient documentation

## 2020-06-23 DIAGNOSIS — H9201 Otalgia, right ear: Secondary | ICD-10-CM | POA: Insufficient documentation

## 2020-06-23 NOTE — ED Triage Notes (Signed)
Pt arrived via POV with reports of R ear pain for the past day or two states the pain worsened today, states she gets a shooting pain behind her ear. Pt denies any changes to hearing. Denies any drainage noted. Pt states she has been having allergy sxs recently.  Pt states she has been taking ibuprofen with little relief.

## 2020-06-24 ENCOUNTER — Emergency Department
Admission: EM | Admit: 2020-06-24 | Discharge: 2020-06-24 | Disposition: A | Discharge status: Home / Self Care | Payer: Medicaid Other | Attending: Emergency Medicine | Admitting: Emergency Medicine

## 2021-03-13 IMAGING — DX DG CHEST 1V PORT
1 series · 1 of 1 positions shown · non-contrast
Comparison: None.

CLINICAL DATA: UIAPU-RR, dyspnea

EXAM:
PORTABLE CHEST 1 VIEW

[chest ap]
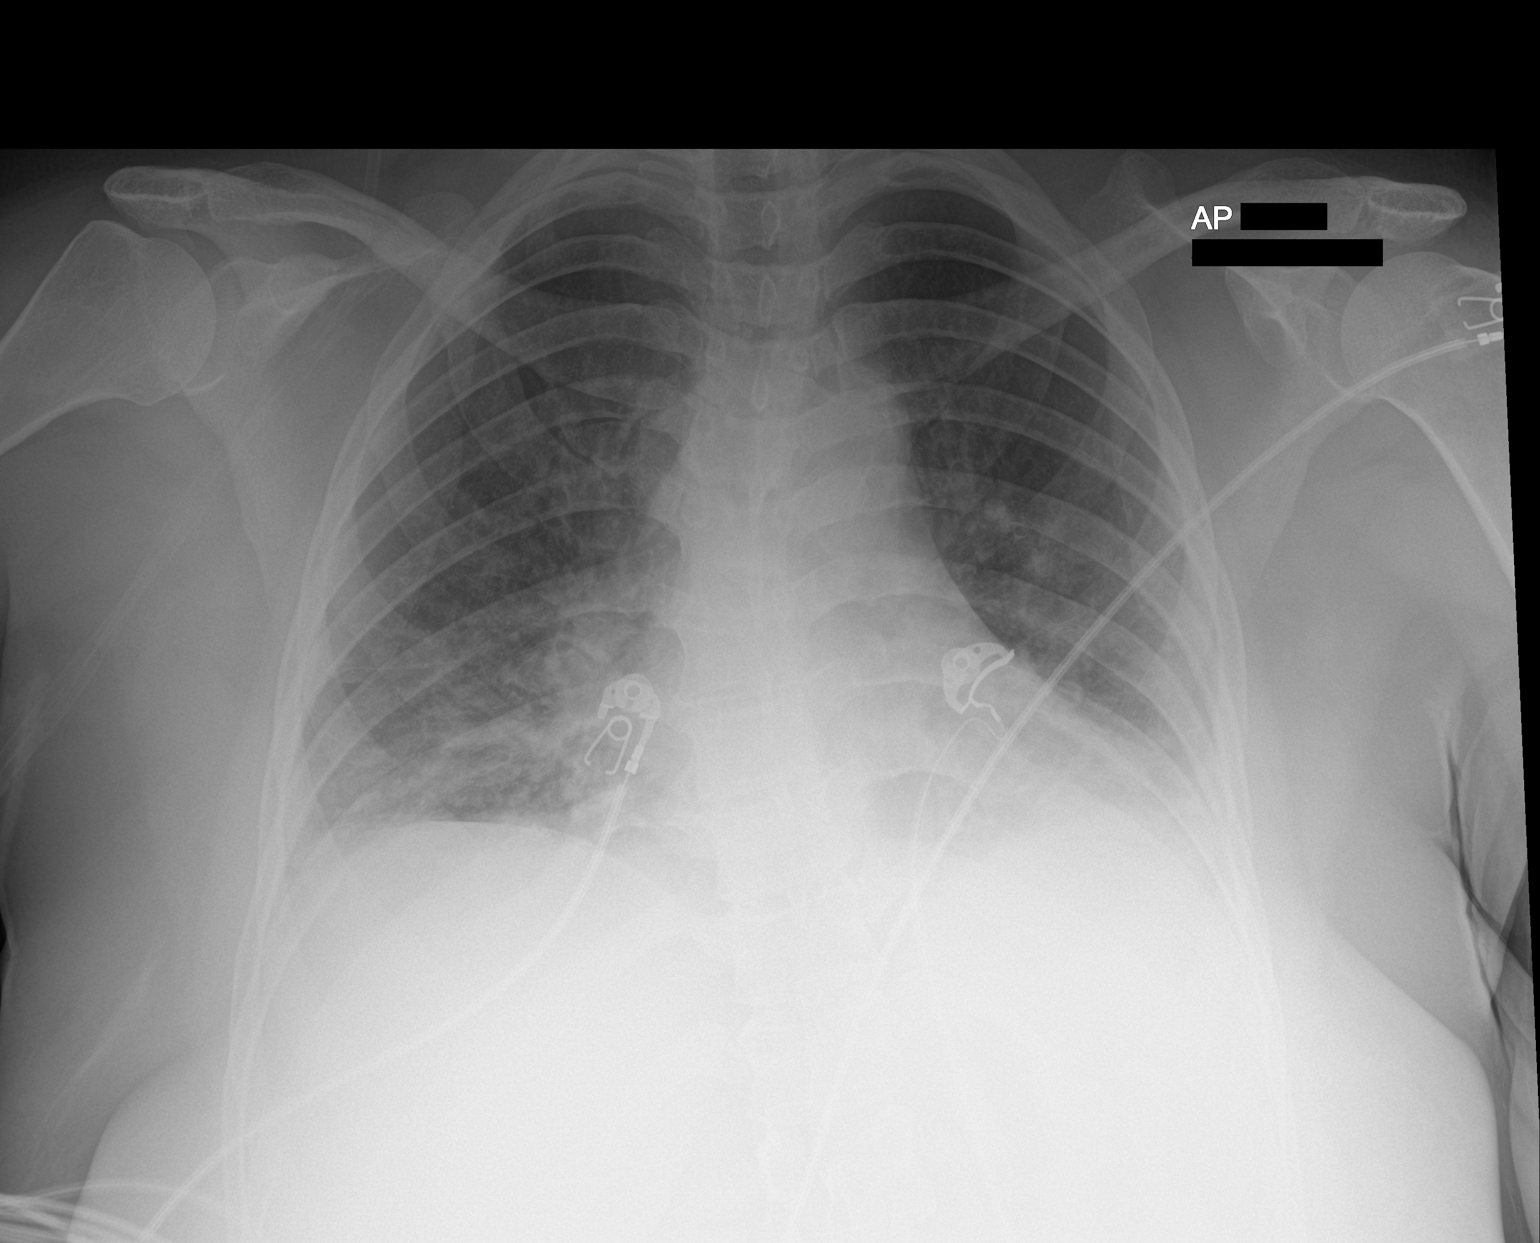

[1 of 1 positions shown; findings below may reference images not displayed]

FINDINGS: Patchy interstitial and airspace opacities with basilar
predominance, right greater than left. Slight blunting of the left
costophrenic sulcus may reflect basilar consolidation or trace
effusion. No right effusion or pneumothorax. The cardiomediastinal
contours are unremarkable. No acute osseous or soft tissue
abnormality.
IMPRESSION: 1. Patchy interstitial and airspace opacities with basilar
predominance, right greater than left, suspicious for multifocal
pneumonia.
2. Slight blunting of the left costophrenic sulcus may reflect
basilar consolidation or trace effusion.

## 2021-03-21 IMAGING — DX DG CHEST 1V PORT
1 series · 1 of 1 positions shown · non-contrast
Comparison: December 28, 2018

CLINICAL DATA: Shortness of breath.  [id number] positive

EXAM:
PORTABLE CHEST 1 VIEW

[chest ap]
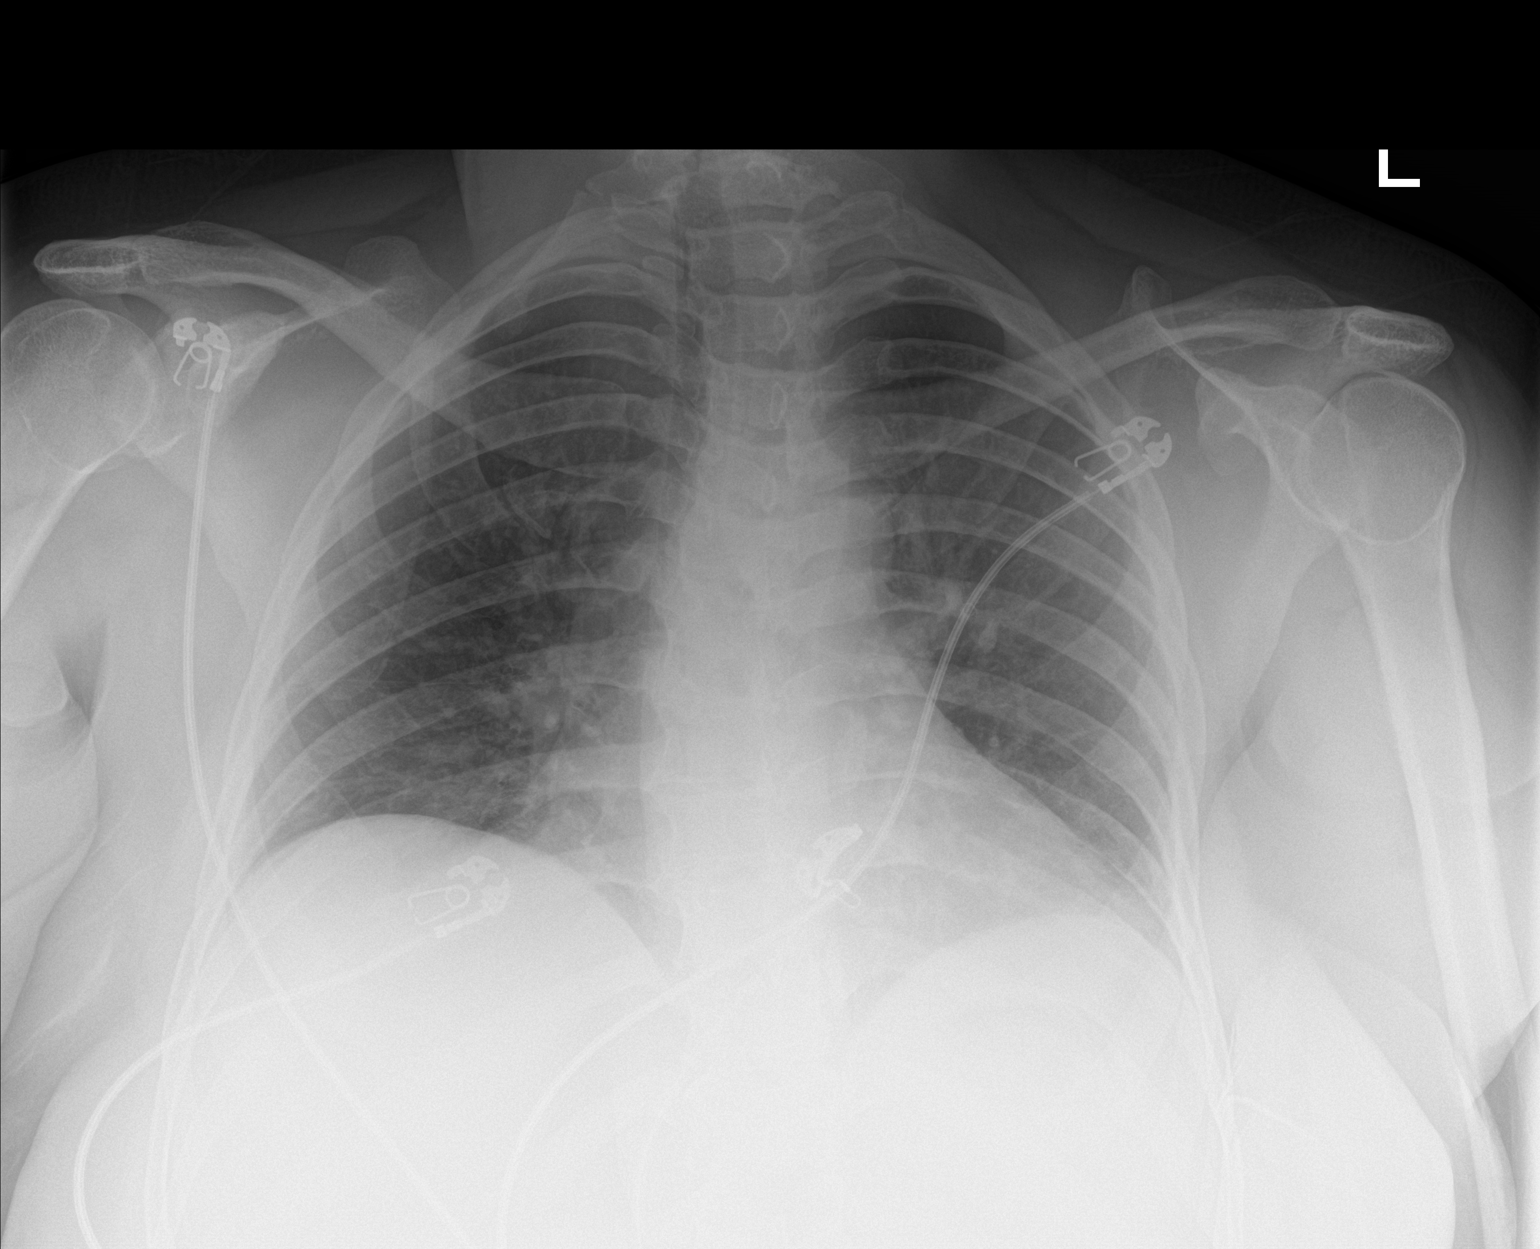

[1 of 1 positions shown; findings below may reference images not displayed]

FINDINGS: Lungs are clear. Heart size and pulmonary vascularity are normal. No
adenopathy. No bone lesions.
IMPRESSION: No edema or consolidation.  No evident adenopathy.

## 2021-04-24 ENCOUNTER — Emergency Department (INDEPENDENT_AMBULATORY_CARE_PROVIDER_SITE_OTHER)
Admission: EM | Admit: 2021-04-24 | Discharge: 2021-04-24 | Disposition: A | Discharge status: Home / Self Care | Payer: Self-pay | Source: Home / Self Care | Attending: Family Medicine | Admitting: Family Medicine

## 2021-04-24 DIAGNOSIS — M545 Low back pain, unspecified: Secondary | ICD-10-CM

## 2021-04-24 DIAGNOSIS — E119 Type 2 diabetes mellitus without complications: Secondary | ICD-10-CM

## 2021-04-24 DIAGNOSIS — M461 Sacroiliitis, not elsewhere classified: Secondary | ICD-10-CM

## 2021-04-24 DIAGNOSIS — S39012A Strain of muscle, fascia and tendon of lower back, initial encounter: Secondary | ICD-10-CM

## 2021-04-24 LAB — POCT URINALYSIS DIP (MANUAL ENTRY)
Bilirubin, UA: NEGATIVE
Blood, UA: NEGATIVE
Glucose, UA: >=1000 mg/dL — AB
Ketones, POC UA: NEGATIVE mg/dL
Leukocytes, UA: NEGATIVE
Nitrite, UA: NEGATIVE
Protein Ur, POC: NEGATIVE mg/dL
Spec Grav, UA: 1.02 (ref 1.010–1.025)
Urobilinogen, UA: 0.2 E.U./dL
pH, UA: 5.5 (ref 5.0–8.0)

## 2021-04-24 LAB — POCT FASTING CBG KUC MANUAL ENTRY: POCT Glucose (KUC): 270 mg/dL — AB (ref 70–99)

## 2021-04-24 MED ORDER — NAPROXEN SODIUM 550 MG PO TABS: 550.0000 mg | ORAL_TABLET | Freq: Two times a day (BID) | ORAL | 0 refills | Status: DC

## 2021-04-24 MED ORDER — METHOCARBAMOL 500 MG PO TABS: 500.0000 mg | ORAL_TABLET | Freq: Three times a day (TID) | ORAL | 0 refills | Status: DC | PRN

## 2021-04-24 NOTE — ED Triage Notes (Signed)
Pt states that she has some lower back pain that radiates to her left side. X1 week ? ?Pt states that she recently got over a yeast infection as well. ?

## 2021-04-24 NOTE — ED Provider Notes (Signed)
?[facility] ? ? ? ?CSN: [id number] ?Arrival date & time: 04/24/21  1434 ? ? ?  ? ?History   ?Chief Complaint ?Chief Complaint  ?Patient presents with  ? Back Pain  ?  Left lower back pain that radiates to left side x1 week  ? ? ?HPI ?Cynthia Parsons is a 51 y.o. female.  ? ?HPI ? ?Patient is here for low back pain.  It hurts in her left low back.  She has started doing more exercising and thinks this may have been the cause.  Does not remember any accident incident.  No falls.  No prior history of back problems. ?No numbness or weakness into the legs ?No bowel or bladder complaints ? ? ?Past Medical History:  ?Diagnosis Date  ? GERD (gastroesophageal reflux disease)   ? Gout 09/28/2017  ? Pneumonia   ? ? ?Patient Active Problem List  ? Diagnosis Date Noted  ? Acute respiratory failure due to COVID-19 [facility]) 12/29/2018  ? Diarrhea 12/29/2018  ? Microcytic anemia 12/29/2018  ? COVID-19 12/28/2018  ? ? ?Past Surgical History:  ?Procedure Laterality Date  ? BREAST SURGERY    ? CHOLECYSTECTOMY    ? ? ?OB History   ?No obstetric history on file. ?  ? ? ? ?Home Medications   ? ?Prior to Admission medications   ?Medication Sig Start Date End Date Taking? Authorizing Provider  ?naproxen sodium (ANAPROX DS) 550 MG tablet Take 1 tablet (550 mg total) by mouth 2 (two) times daily with a meal. 04/24/21  Yes Raylene Everts, MD  ?blood glucose meter kit and supplies KIT Dispense based on patient and insurance preference. Use up to four times daily as directed. (FOR ICD-9 250.00, 250.01). 01/07/19   McDonald, Mia A, PA-C  ?glucose blood test strip Use as instructed 02/18/19   Kerin Perna, NP  ?Iron, Ferrous Sulfate, 325 (65 Fe) MG TABS Take 325 mg by mouth 1 day or 1 dose for 1 dose. 04/10/19 04/11/19  Kerin Perna, NP  ?metFORMIN (GLUCOPHAGE) 1000 MG tablet Take 1 tablet (1,000 mg total) by mouth 2 (two) times daily with a meal. 12/11/19   Kerin Perna, NP  ?methocarbamol (ROBAXIN) 500 MG tablet  Take 1 tablet (500 mg total) by mouth every 8 (eight) hours as needed for muscle spasms. 04/24/21   Raylene Everts, MD  ?glipiZIDE (GLUCOTROL) 10 MG tablet Take 1 tablet (10 mg total) by mouth 2 (two) times daily before a meal. 01/16/19 03/11/19  Kerin Perna, NP  ? ? ?Family History ?Family History  ?Problem Relation Age of Onset  ? Hypertension Mother   ? Diabetes Mother   ? Stroke Mother   ? Hypertension Father   ? Diabetes Father   ? ? ?Social History ?Social History  ? ?Tobacco Use  ? Smoking status: Never  ? Smokeless tobacco: Never  ?Vaping Use  ? Vaping Use: Never used  ?Substance Use Topics  ? Alcohol use: No  ? Drug use: No  ? ? ? ?Allergies   ?Penicillins ? ? ?Review of Systems ?Review of Systems ?See HPI ? ?Physical Exam ?Triage Vital Signs ?ED Triage Vitals  ?Enc Vitals Group  ?   BP 04/24/21 1455 138/82  ?   Pulse Rate 04/24/21 1455 (!) 102  ?   Resp 04/24/21 1455 18  ?   Temp 04/24/21 1455 98 ?F (36.7 ?C)  ?   Temp Source 04/24/21 1455 Oral  ?   SpO2 04/24/21  1455 98 %  ?   Weight 04/24/21 1451 240 lb (108.9 kg)  ?   Height 04/24/21 1451 _0  (1.702 m)  ?   Head Circumference --   ?   Peak Flow --   ?   Pain Score 04/24/21 1451 10  ?   Pain Loc --   ?   Pain Edu? --   ?   Excl. in [place]? --   ? ?No data found. ? ?Updated Vital Signs ?BP 138/82 (BP Location: Left Arm)   Pulse (!) 102   Temp 98 ?F (36.7 ?C) (Oral)   Resp 18   Ht _1  (1.702 m)   Wt 108.9 kg   SpO2 98%   BMI 37.59 kg/m?  ?   ? ?Physical Exam ?Constitutional:   ?   General: She is not in acute distress. ?   Appearance: She is well-developed. She is obese.  ?HENT:  ?   Head: Normocephalic and atraumatic.  ?   Mouth/Throat:  ?   Comments: Mask is in place ?Eyes:  ?   Conjunctiva/sclera: Conjunctivae normal.  ?   Pupils: Pupils are equal, round, and reactive to light.  ?Cardiovascular:  ?   Rate and Rhythm: Normal rate and regular rhythm.  ?   Heart sounds: Normal heart sounds.  ?Pulmonary:  ?   Effort: Pulmonary effort is  normal. No respiratory distress.  ?   Breath sounds: Normal breath sounds.  ?Abdominal:  ?   General: There is no distension.  ?   Palpations: Abdomen is soft.  ?Musculoskeletal:     ?   General: Normal range of motion.  ?   Cervical back: Normal range of motion.  ?   Comments: Tenderness is localized to the left SI joint.  Mild tenderness in the left lumbar column of muscles with no palpable spasm.  Full but slow range of motion.  No focal neurologic findings.  Straight leg raise negative bilaterally.  ?Skin: ?   General: Skin is warm and dry.  ?Neurological:  ?   Mental Status: She is alert.  ? ? ? ?UC Treatments / Results  ?Labs ?(all labs ordered are listed, but only abnormal results are displayed) ?Labs Reviewed  ?POCT URINALYSIS DIP (MANUAL ENTRY) - Abnormal; Notable for the following components:  ?    Result Value  ? Glucose, UA >=1,000 (*)   ? All other components within normal limits  ?POCT FASTING CBG [facility] MANUAL ENTRY - Abnormal; Notable for the following components:  ? POCT Glucose ([facility]) 270 (*)   ? All other components within normal limits  ? ? ?EKG ? ? ?Radiology ?No results found. ? ?Procedures ?Procedures (including critical care time) ? ?Medications Ordered in UC ?Medications - No data to display ? ?Initial Impression / Assessment and Plan / UC Course  ?I have reviewed the triage vital signs and the nursing notes. ? ?Pertinent labs & imaging results that were available during my care of the patient were reviewed by me and considered in my medical decision making (see chart for details). ? ?  ? ?It was noted on patient's urinalysis high glucose.  We went ahead and did a fingerstick.  It is 270.  I advised the patient that she is diabetic.  She needs to carefully follow a low carbohydrate diet.  I believe she needs to go back on metformin.  I advised her to call her primary care doctor for follow-up ?Final Clinical Impressions(s) / UC Diagnoses  ? ?  Final diagnoses:  ?Lumbar strain, initial encounter   ?Sacroiliac inflammation ([place])  ?Type 2 diabetes mellitus without complication, without long-term current use of insulin ([place])  ? ? ? ?Discharge Instructions   ? ?  ?Take Anaprox 2 times a day with food ?Take muscle relaxer as needed.  This is helpful at night ?Try ice or heat to area ? ?Keep track of your blood sugar.  I recommend checking it twice a day.  Write these down a log and take it to your doctor for follow-up ? ? ? ?ED Prescriptions   ? ? Medication Sig Dispense Auth. Provider  ? methocarbamol (ROBAXIN) 500 MG tablet Take 1 tablet (500 mg total) by mouth every 8 (eight) hours as needed for muscle spasms. 40 tablet Raylene Everts, MD  ? naproxen sodium (ANAPROX DS) 550 MG tablet Take 1 tablet (550 mg total) by mouth 2 (two) times daily with a meal. 30 tablet Raylene Everts, MD  ? ?  ? ?PDMP not reviewed this encounter. ?  ?Raylene Everts, MD ?04/24/21 1653 ? ?

## 2021-04-24 NOTE — Discharge Instructions (Addendum)
Take Anaprox 2 times a day with food ?Take muscle relaxer as needed.  This is helpful at night ?Try ice or heat to area ? ?Keep track of your blood sugar.  I recommend checking it twice a day.  Write these down a log and take it to your doctor for follow-up ? ?

## 2022-04-18 ENCOUNTER — Ambulatory Visit (INDEPENDENT_AMBULATORY_CARE_PROVIDER_SITE_OTHER): Payer: Self-pay | Admitting: Primary Care

## 2022-04-18 ENCOUNTER — Encounter (INDEPENDENT_AMBULATORY_CARE_PROVIDER_SITE_OTHER): Payer: Self-pay | Admitting: Primary Care

## 2022-04-18 DIAGNOSIS — F32A Depression, unspecified: Secondary | ICD-10-CM

## 2022-04-18 DIAGNOSIS — M545 Low back pain, unspecified: Secondary | ICD-10-CM

## 2022-04-18 DIAGNOSIS — F419 Anxiety disorder, unspecified: Secondary | ICD-10-CM

## 2022-04-18 DIAGNOSIS — M542 Cervicalgia: Secondary | ICD-10-CM

## 2022-04-18 MED ORDER — METHOCARBAMOL 500 MG PO TABS: 500.0000 mg | ORAL_TABLET | Freq: Three times a day (TID) | ORAL | 1 refills | Status: DC | PRN
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
## 2022-04-18 NOTE — Addendum Note (Signed)
Addended by: [name] on: 04/18/2022 03:29 PM   Modules accepted: Orders

## 2022-04-20 ENCOUNTER — Telehealth: Payer: Self-pay | Admitting: Primary Care

## 2022-04-20 ENCOUNTER — Encounter (INDEPENDENT_AMBULATORY_CARE_PROVIDER_SITE_OTHER): Payer: Self-pay

## 2022-04-20 NOTE — Telephone Encounter (Signed)
Copied from [place] [id number]. Topic: Referral - Question >> Apr 20, 2022  4:20 PM Erskine Squibb wrote: Reason for CRM: The patient called in to let her provider know that [facility] does not accept her insurance and she would like a new referral to be sent to [facility]. She said they are waiting for it and will schedule an appt for her as soon as the get it. The number there is [phone]

## 2022-04-20 NOTE — Telephone Encounter (Signed)
Cynthia Parsons would you be able to take care of this

## 2022-05-03 ENCOUNTER — Telehealth (INDEPENDENT_AMBULATORY_CARE_PROVIDER_SITE_OTHER): Payer: Self-pay | Admitting: Licensed Clinical Social Worker

## 2022-05-03 NOTE — Telephone Encounter (Signed)
LCSWA called patient today to introduce herself and to assess patients' mental health needs. Patient did not answer the phone. LCSWA was able to leave a brief message with the patient asking them to return the call. Patient was referred by PCP for anxiety and depression.   

## 2022-05-04 ENCOUNTER — Other Ambulatory Visit: Payer: Self-pay

## 2022-05-04 ENCOUNTER — Ambulatory Visit: Payer: 59 | Attending: Primary Care | Admitting: Physical Therapy

## 2022-05-04 ENCOUNTER — Encounter: Payer: Self-pay | Admitting: Physical Therapy

## 2022-05-04 DIAGNOSIS — M542 Cervicalgia: Secondary | ICD-10-CM | POA: Diagnosis not present

## 2022-05-04 DIAGNOSIS — F419 Anxiety disorder, unspecified: Secondary | ICD-10-CM | POA: Diagnosis not present

## 2022-05-04 DIAGNOSIS — F32A Depression, unspecified: Secondary | ICD-10-CM | POA: Diagnosis not present

## 2022-05-04 DIAGNOSIS — M6281 Muscle weakness (generalized): Secondary | ICD-10-CM

## 2022-05-04 DIAGNOSIS — R293 Abnormal posture: Secondary | ICD-10-CM

## 2022-05-04 DIAGNOSIS — M79602 Pain in left arm: Secondary | ICD-10-CM | POA: Insufficient documentation

## 2022-05-04 DIAGNOSIS — X58XXXS Exposure to other specified factors, sequela: Secondary | ICD-10-CM | POA: Insufficient documentation
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
## 2022-05-11 ENCOUNTER — Encounter: Payer: Self-pay | Admitting: Physical Therapy

## 2022-05-11 ENCOUNTER — Ambulatory Visit: Payer: 59 | Attending: Primary Care | Admitting: Physical Therapy

## 2022-05-11 DIAGNOSIS — M542 Cervicalgia: Secondary | ICD-10-CM | POA: Insufficient documentation

## 2022-05-11 DIAGNOSIS — M6281 Muscle weakness (generalized): Secondary | ICD-10-CM | POA: Insufficient documentation

## 2022-05-11 DIAGNOSIS — M79602 Pain in left arm: Secondary | ICD-10-CM | POA: Diagnosis not present

## 2022-05-11 DIAGNOSIS — R293 Abnormal posture: Secondary | ICD-10-CM | POA: Insufficient documentation
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
## 2022-05-13 ENCOUNTER — Ambulatory Visit: Payer: 59

## 2022-05-18 ENCOUNTER — Encounter: Payer: Self-pay | Admitting: Physical Therapy

## 2022-05-18 ENCOUNTER — Ambulatory Visit: Payer: 59 | Admitting: Physical Therapy

## 2022-05-18 DIAGNOSIS — M542 Cervicalgia: Secondary | ICD-10-CM

## 2022-05-18 DIAGNOSIS — M6281 Muscle weakness (generalized): Secondary | ICD-10-CM

## 2022-05-18 DIAGNOSIS — M79602 Pain in left arm: Secondary | ICD-10-CM

## 2022-05-18 DIAGNOSIS — R293 Abnormal posture: Secondary | ICD-10-CM
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
## 2022-05-19 ENCOUNTER — Ambulatory Visit: Payer: Medicaid Other | Admitting: Physical Therapy

## 2022-05-20 ENCOUNTER — Ambulatory Visit: Payer: 59 | Admitting: Physical Therapy

## 2022-05-20 ENCOUNTER — Encounter: Payer: Self-pay | Admitting: Physical Therapy

## 2022-05-20 DIAGNOSIS — M542 Cervicalgia: Secondary | ICD-10-CM

## 2022-05-20 DIAGNOSIS — R293 Abnormal posture: Secondary | ICD-10-CM

## 2022-05-20 DIAGNOSIS — M79602 Pain in left arm: Secondary | ICD-10-CM | POA: Diagnosis not present

## 2022-05-20 DIAGNOSIS — M6281 Muscle weakness (generalized): Secondary | ICD-10-CM | POA: Diagnosis not present
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
## 2022-05-25 ENCOUNTER — Ambulatory Visit: Payer: 59 | Admitting: Physical Therapy

## 2022-05-25 ENCOUNTER — Ambulatory Visit: Payer: Medicaid Other | Admitting: Physical Therapy

## 2022-05-25 ENCOUNTER — Encounter: Payer: Self-pay | Admitting: Physical Therapy

## 2022-05-25 DIAGNOSIS — M542 Cervicalgia: Secondary | ICD-10-CM

## 2022-05-25 DIAGNOSIS — R293 Abnormal posture: Secondary | ICD-10-CM

## 2022-05-25 DIAGNOSIS — M79602 Pain in left arm: Secondary | ICD-10-CM | POA: Diagnosis not present

## 2022-05-25 DIAGNOSIS — M6281 Muscle weakness (generalized): Secondary | ICD-10-CM
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
## 2022-05-27 ENCOUNTER — Ambulatory Visit: Payer: 59 | Admitting: Rehabilitative and Restorative Service Providers"

## 2022-05-27 ENCOUNTER — Encounter: Payer: Self-pay | Admitting: Rehabilitative and Restorative Service Providers"

## 2022-05-27 DIAGNOSIS — M79602 Pain in left arm: Secondary | ICD-10-CM | POA: Diagnosis not present

## 2022-05-27 DIAGNOSIS — R293 Abnormal posture: Secondary | ICD-10-CM | POA: Diagnosis not present

## 2022-05-27 DIAGNOSIS — M6281 Muscle weakness (generalized): Secondary | ICD-10-CM | POA: Diagnosis not present

## 2022-05-27 DIAGNOSIS — M542 Cervicalgia: Secondary | ICD-10-CM
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
## 2022-05-30 ENCOUNTER — Ambulatory Visit (INDEPENDENT_AMBULATORY_CARE_PROVIDER_SITE_OTHER): Payer: Self-pay | Admitting: *Deleted

## 2022-05-30 ENCOUNTER — Other Ambulatory Visit (INDEPENDENT_AMBULATORY_CARE_PROVIDER_SITE_OTHER): Payer: Self-pay | Admitting: Primary Care

## 2022-05-30 NOTE — Telephone Encounter (Signed)
Will forward to provider  
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
## 2022-05-31 NOTE — Telephone Encounter (Signed)
Pt. Calling again to ask for something for pain. Informed pt. PCP will get back to her after she reviews request.

## 2022-05-31 NOTE — Telephone Encounter (Signed)
Will forward to provider  

## 2022-06-01 ENCOUNTER — Encounter: Payer: Self-pay | Admitting: Physical Therapy

## 2022-06-01 ENCOUNTER — Ambulatory Visit: Payer: 59 | Admitting: Physical Therapy

## 2022-06-01 ENCOUNTER — Other Ambulatory Visit (INDEPENDENT_AMBULATORY_CARE_PROVIDER_SITE_OTHER): Payer: Self-pay | Admitting: Primary Care

## 2022-06-01 DIAGNOSIS — M542 Cervicalgia: Secondary | ICD-10-CM

## 2022-06-01 DIAGNOSIS — M6281 Muscle weakness (generalized): Secondary | ICD-10-CM | POA: Diagnosis not present

## 2022-06-01 DIAGNOSIS — M79602 Pain in left arm: Secondary | ICD-10-CM

## 2022-06-01 DIAGNOSIS — R293 Abnormal posture: Secondary | ICD-10-CM | POA: Diagnosis not present

## 2022-06-01 MED ORDER — NAPROXEN SODIUM 550 MG PO TABS: 550.0000 mg | ORAL_TABLET | Freq: Two times a day (BID) | ORAL | 1 refills | Status: DC
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
## 2022-06-02 ENCOUNTER — Encounter: Payer: Medicaid Other | Admitting: Physical Therapy

## 2022-06-02 NOTE — Telephone Encounter (Signed)
Contacted pt and left detailed vm making pt aware that Cynthia Parsons sent in rx to the pharmacy and if she has any questions or concerns to give [place] a call

## 2022-06-03 ENCOUNTER — Ambulatory Visit: Payer: 59 | Admitting: Physical Therapy

## 2022-06-03 ENCOUNTER — Encounter: Payer: Self-pay | Admitting: Physical Therapy

## 2022-06-03 DIAGNOSIS — M542 Cervicalgia: Secondary | ICD-10-CM | POA: Diagnosis not present

## 2022-06-03 DIAGNOSIS — M6281 Muscle weakness (generalized): Secondary | ICD-10-CM | POA: Diagnosis not present

## 2022-06-03 DIAGNOSIS — M79602 Pain in left arm: Secondary | ICD-10-CM

## 2022-06-03 DIAGNOSIS — R293 Abnormal posture: Secondary | ICD-10-CM
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
## 2022-06-08 ENCOUNTER — Ambulatory Visit: Payer: 59

## 2022-06-08 ENCOUNTER — Ambulatory Visit (INDEPENDENT_AMBULATORY_CARE_PROVIDER_SITE_OTHER): Payer: 59 | Admitting: Primary Care

## 2022-06-09 ENCOUNTER — Encounter: Payer: Medicaid Other | Admitting: Physical Therapy

## 2022-06-10 ENCOUNTER — Ambulatory Visit: Payer: 59 | Attending: Primary Care

## 2022-06-10 DIAGNOSIS — M79602 Pain in left arm: Secondary | ICD-10-CM | POA: Insufficient documentation

## 2022-06-10 DIAGNOSIS — M542 Cervicalgia: Secondary | ICD-10-CM | POA: Insufficient documentation

## 2022-06-10 DIAGNOSIS — R293 Abnormal posture: Secondary | ICD-10-CM | POA: Diagnosis not present

## 2022-06-10 DIAGNOSIS — M6281 Muscle weakness (generalized): Secondary | ICD-10-CM | POA: Insufficient documentation
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
## 2022-06-14 ENCOUNTER — Encounter (INDEPENDENT_AMBULATORY_CARE_PROVIDER_SITE_OTHER): Payer: Self-pay | Admitting: Primary Care

## 2022-06-14 ENCOUNTER — Ambulatory Visit (INDEPENDENT_AMBULATORY_CARE_PROVIDER_SITE_OTHER): Payer: 59 | Admitting: Primary Care

## 2022-06-14 DIAGNOSIS — M542 Cervicalgia: Secondary | ICD-10-CM | POA: Diagnosis not present

## 2022-06-14 DIAGNOSIS — E119 Type 2 diabetes mellitus without complications: Secondary | ICD-10-CM | POA: Diagnosis not present

## 2022-06-14 DIAGNOSIS — N912 Amenorrhea, unspecified: Secondary | ICD-10-CM

## 2022-06-14 DIAGNOSIS — Z1211 Encounter for screening for malignant neoplasm of colon: Secondary | ICD-10-CM

## 2022-06-14 DIAGNOSIS — Z7985 Long-term (current) use of injectable non-insulin antidiabetic drugs: Secondary | ICD-10-CM | POA: Diagnosis not present

## 2022-06-14 LAB — POCT GLYCOSYLATED HEMOGLOBIN (HGB A1C): HbA1c, POC (controlled diabetic range): 11.4 % — AB (ref 0.0–7.0)
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
## 2022-06-14 NOTE — Patient Instructions (Addendum)
Semaglutide Injection What is this medication? SEMAGLUTIDE (SEM a GLOO tide) treats type 2 diabetes. It works by increasing insulin levels in your body, which decreases your blood sugar (glucose). It also reduces the amount of sugar released into the blood and slows down your digestion. It can also be used to lower the risk of heart attack and stroke in people with type 2 diabetes. Changes to diet and exercise are often combined with this medication. This medicine may be used for other purposes; ask your health care provider or pharmacist if you have questions. COMMON BRAND NAME(S): OZEMPIC What should I tell my care team before I take this medication? They need to know if you have any of these conditions: Endocrine tumors (MEN 2) or if someone in your family had these tumors Eye disease, vision problems History of pancreatitis Kidney disease Stomach problems Thyroid cancer or if someone in your family had thyroid cancer An unusual or allergic reaction to semaglutide, other medications, foods, dyes, or preservatives Pregnant or trying to get pregnant Breast-feeding How should I use this medication? This medication is for injection under the skin of your upper leg (thigh), stomach area, or upper arm. It is given once every week (every 7 days). You will be taught how to prepare and give this medication. Use exactly as directed. Take your medication at regular intervals. Do not take it more often than directed. If you use this medication with insulin, you should inject this medication and the insulin separately. Do not mix them together. Do not give the injections right next to each other. Change (rotate) injection sites with each injection. It is important that you put your used needles and syringes in a special sharps container. Do not put them in a trash can. If you do not have a sharps container, call your pharmacist or care team to get one. A special MedGuide will be given to you by the  pharmacist with each prescription and refill. Be sure to read this information carefully each time. This medication comes with INSTRUCTIONS FOR USE. Ask your pharmacist for directions on how to use this medication. Read the information carefully. Talk to your pharmacist or care team if you have questions. Talk to your care team about the use of this medication in children. Special care may be needed. Overdosage: If you think you have taken too much of this medicine contact a poison control center or emergency room at once. NOTE: This medicine is only for you. Do not share this medicine with others. What if I miss a dose? If you miss a dose, take it as soon as you can within 5 days after the missed dose. Then take your next dose at your regular weekly time. If it has been longer than 5 days after the missed dose, do not take the missed dose. Take the next dose at your regular time. Do not take double or extra doses. If you have questions about a missed dose, contact your care team for advice. What may interact with this medication? Other medications for diabetes Many medications may cause changes in blood sugar, these include: Alcohol containing beverages Antiviral medications for HIV or AIDS Aspirin and aspirin-like medications Certain medications for blood pressure, heart disease, irregular heart beat Chromium Diuretics Female hormones, such as estrogens or progestins, birth control pills Fenofibrate Gemfibrozil Isoniazid Lanreotide Female hormones or anabolic steroids MAOIs like Carbex, Eldepryl, Marplan, Nardil, and Parnate Medications for weight loss Medications for allergies, asthma, cold, or cough Medications for depression,   anxiety, or psychotic disturbances Niacin Nicotine NSAIDs, medications for pain and inflammation, like ibuprofen or naproxen Octreotide Pasireotide Pentamidine Phenytoin Probenecid Quinolone antibiotics such as ciprofloxacin, levofloxacin, ofloxacin Some  herbal dietary supplements Steroid medications such as prednisone or cortisone Sulfamethoxazole; trimethoprim Thyroid hormones Some medications can hide the warning symptoms of low blood sugar (hypoglycemia). You may need to monitor your blood sugar more closely if you are taking one of these medications. These include: Beta-blockers, often used for high blood pressure or heart problems (examples include atenolol, metoprolol, propranolol) Clonidine Guanethidine Reserpine This list may not describe all possible interactions. Give your health care provider a list of all the medicines, herbs, non-prescription drugs, or dietary supplements you use. Also tell them if you smoke, drink alcohol, or use illegal drugs. Some items may interact with your medicine. What should I watch for while using this medication? Visit your care team for regular checks on your progress. Drink plenty of fluids while taking this medication. Check with your care team if you get an attack of severe diarrhea, nausea, and vomiting. The loss of too much body fluid can make it dangerous for you to take this medication. A test called the HbA1C (A1C) will be monitored. This is a simple blood test. It measures your blood sugar control over the last 2 to 3 months. You will receive this test every 3 to 6 months. Learn how to check your blood sugar. Learn the symptoms of low and high blood sugar and how to manage them. Always carry a quick-source of sugar with you in case you have symptoms of low blood sugar. Examples include hard sugar candy or glucose tablets. Make sure others know that you can choke if you eat or drink when you develop serious symptoms of low blood sugar, such as seizures or unconsciousness. They must get medical help at once. Tell your care team if you have high blood sugar. You might need to change the dose of your medication. If you are sick or exercising more than usual, you might need to change the dose of your  medication. Do not skip meals. Ask your care team if you should avoid alcohol. Many nonprescription cough and cold products contain sugar or alcohol. These can affect blood sugar. Pens should never be shared. Even if the needle is changed, sharing may result in passing of viruses like hepatitis or HIV. Wear a medical ID bracelet or chain, and carry a card that describes your disease and details of your medication and dosage times. Do not become pregnant while taking this medication. Women should inform their care team if they wish to become pregnant or think they might be pregnant. There is a potential for serious side effects to an unborn child. Talk to your care team for more information. What side effects may I notice from receiving this medication? Side effects that you should report to your care team as soon as possible: Allergic reactions--skin rash, itching, hives, swelling of the face, lips, tongue, or throat Change in vision Dehydration--increased thirst, dry mouth, feeling faint or lightheaded, headache, dark yellow or brown urine Gallbladder problems--severe stomach pain, nausea, vomiting, fever Heart palpitations--rapid, pounding, or irregular heartbeat Kidney injury--decrease in the amount of urine, swelling of the ankles, hands, or feet Pancreatitis--severe stomach pain that spreads to your back or gets worse after eating or when touched, fever, nausea, vomiting Thyroid cancer--new mass or lump in the neck, pain or trouble swallowing, trouble breathing, hoarseness Side effects that usually do not require medical  attention (report to your care team if they continue or are bothersome): Diarrhea Loss of appetite Nausea Stomach pain Vomiting This list may not describe all possible side effects. Call your doctor for medical advice about side effects. You may report side effects to FDA at 1-800-FDA-1088. Where should I keep my medication? Keep out of the reach of children. Store  unopened pens in a refrigerator between 2 and 8 degrees C (36 and 46 degrees F). Do not freeze. Protect from light and heat. After you first use the pen, it can be stored for 56 days at room temperature between 15 and 30 degrees C (59 and 86 degrees F) or in a refrigerator. Throw away your used pen after 56 days or after the expiration date, whichever comes first. Do not store your pen with the needle attached. If the needle is left on, medication may leak from the pen. NOTE: This sheet is a summary. It may not cover all possible information. If you have questions about this medicine, talk to your doctor, pharmacist, or health care provider.  2023 Elsevier/Gold Standard (2020-04-30 00:00:00) Discussed  co- morbidities with uncontrol diabetes  Complications -diabetic retinopathy, (close your eyes ? What do you see nothing) nephropathy decrease in kidney function- can lead to dialysis-on a machine 3 days a week to filter your kidney, neuropathy- numbness and tinging in your hands and feet,  increase risk of heart attack and stroke, and amputation due to decrease wound healing and circulation. Decrease your risk by taking medication daily as prescribed, monitor carbohydrates- foods that are high in carbohydrates are the following rice, potatoes, breads, sugars, and pastas.  Reduction in the intake (eating) will assist in lowering your blood sugars. Exercise daily at least 30 minutes daily.

## 2022-06-15 ENCOUNTER — Telehealth: Payer: Self-pay | Admitting: Pharmacist

## 2022-06-15 ENCOUNTER — Other Ambulatory Visit: Payer: Self-pay | Admitting: Family Medicine

## 2022-06-15 ENCOUNTER — Ambulatory Visit: Payer: 59 | Admitting: Physical Therapy

## 2022-06-15 ENCOUNTER — Other Ambulatory Visit: Payer: Self-pay | Admitting: Pharmacist

## 2022-06-15 ENCOUNTER — Ambulatory Visit (INDEPENDENT_AMBULATORY_CARE_PROVIDER_SITE_OTHER): Payer: 59 | Admitting: Primary Care

## 2022-06-15 LAB — CBC WITH DIFFERENTIAL/PLATELET
Basophils Absolute: 0 10*3/uL (ref 0.0–0.2)
Basos: 1 %
EOS (ABSOLUTE): 0.1 10*3/uL (ref 0.0–0.4)
Eos: 2 %
Hematocrit: 44.5 % (ref 34.0–46.6)
Hemoglobin: 13.6 g/dL (ref 11.1–15.9)
Immature Grans (Abs): 0 10*3/uL (ref 0.0–0.1)
Immature Granulocytes: 0 %
Lymphocytes Absolute: 2.1 10*3/uL (ref 0.7–3.1)
Lymphs: 28 %
MCH: 24.2 pg — ABNORMAL LOW (ref 26.6–33.0)
MCHC: 30.6 g/dL — ABNORMAL LOW (ref 31.5–35.7)
MCV: 79 fL (ref 79–97)
Monocytes Absolute: 0.5 10*3/uL (ref 0.1–0.9)
Monocytes: 7 %
Neutrophils Absolute: 4.7 10*3/uL (ref 1.4–7.0)
Neutrophils: 62 %
Platelets: 337 10*3/uL (ref 150–450)
RBC: 5.63 x10E6/uL — ABNORMAL HIGH (ref 3.77–5.28)
RDW: 15.7 % — ABNORMAL HIGH (ref 11.7–15.4)
WBC: 7.5 10*3/uL (ref 3.4–10.8)

## 2022-06-15 MED ORDER — OZEMPIC (0.25 OR 0.5 MG/DOSE) 2 MG/3ML ~~LOC~~ SOPN: PEN_INJECTOR | SUBCUTANEOUS | 1 refills | Status: DC

## 2022-06-15 NOTE — Telephone Encounter (Signed)
Can we initiate a PA for this patient's Trulicity? I tried Ozempic but it does not appear to be covered.

## 2022-06-16 ENCOUNTER — Encounter: Payer: Medicaid Other | Admitting: Physical Therapy

## 2022-06-17 ENCOUNTER — Ambulatory Visit: Payer: 59 | Admitting: Physical Therapy

## 2022-06-17 ENCOUNTER — Encounter: Payer: Self-pay | Admitting: Physical Therapy

## 2022-06-17 DIAGNOSIS — R293 Abnormal posture: Secondary | ICD-10-CM | POA: Diagnosis not present

## 2022-06-17 DIAGNOSIS — M542 Cervicalgia: Secondary | ICD-10-CM

## 2022-06-17 DIAGNOSIS — M79602 Pain in left arm: Secondary | ICD-10-CM

## 2022-06-17 DIAGNOSIS — M6281 Muscle weakness (generalized): Secondary | ICD-10-CM | POA: Diagnosis not present
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
## 2022-06-20 ENCOUNTER — Ambulatory Visit: Payer: 59 | Admitting: Pharmacist

## 2022-06-21 ENCOUNTER — Ambulatory Visit: Payer: 59 | Admitting: Physical Therapy

## 2022-06-21 ENCOUNTER — Encounter: Payer: Self-pay | Admitting: Physical Therapy

## 2022-06-21 DIAGNOSIS — M6281 Muscle weakness (generalized): Secondary | ICD-10-CM | POA: Diagnosis not present

## 2022-06-21 DIAGNOSIS — M79602 Pain in left arm: Secondary | ICD-10-CM

## 2022-06-21 DIAGNOSIS — M542 Cervicalgia: Secondary | ICD-10-CM

## 2022-06-21 DIAGNOSIS — R293 Abnormal posture: Secondary | ICD-10-CM | POA: Diagnosis not present
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
## 2022-06-22 ENCOUNTER — Other Ambulatory Visit: Payer: Self-pay

## 2022-06-23 ENCOUNTER — Other Ambulatory Visit: Payer: Self-pay

## 2022-06-28 ENCOUNTER — Ambulatory Visit (LOCAL_COMMUNITY_HEALTH_CENTER): Payer: Self-pay

## 2022-06-28 ENCOUNTER — Other Ambulatory Visit: Payer: Self-pay

## 2022-06-28 DIAGNOSIS — Z111 Encounter for screening for respiratory tuberculosis: Secondary | ICD-10-CM

## 2022-06-29 ENCOUNTER — Encounter: Payer: Self-pay | Admitting: Physical Therapy

## 2022-06-29 ENCOUNTER — Ambulatory Visit: Payer: 59 | Admitting: Physical Therapy

## 2022-06-29 DIAGNOSIS — M79602 Pain in left arm: Secondary | ICD-10-CM | POA: Diagnosis not present

## 2022-06-29 DIAGNOSIS — M542 Cervicalgia: Secondary | ICD-10-CM | POA: Diagnosis not present

## 2022-06-29 DIAGNOSIS — M6281 Muscle weakness (generalized): Secondary | ICD-10-CM

## 2022-06-29 DIAGNOSIS — R293 Abnormal posture: Secondary | ICD-10-CM
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
## 2022-07-01 ENCOUNTER — Encounter: Payer: Self-pay | Admitting: Physical Therapy

## 2022-07-01 ENCOUNTER — Ambulatory Visit (LOCAL_COMMUNITY_HEALTH_CENTER): Payer: Self-pay

## 2022-07-01 ENCOUNTER — Ambulatory Visit: Payer: 59 | Admitting: Physical Therapy

## 2022-07-01 DIAGNOSIS — R293 Abnormal posture: Secondary | ICD-10-CM | POA: Diagnosis not present

## 2022-07-01 DIAGNOSIS — M6281 Muscle weakness (generalized): Secondary | ICD-10-CM

## 2022-07-01 DIAGNOSIS — Z111 Encounter for screening for respiratory tuberculosis: Secondary | ICD-10-CM

## 2022-07-01 DIAGNOSIS — M542 Cervicalgia: Secondary | ICD-10-CM | POA: Diagnosis not present

## 2022-07-01 DIAGNOSIS — M79602 Pain in left arm: Secondary | ICD-10-CM | POA: Diagnosis not present

## 2022-07-01 LAB — TB SKIN TEST
Induration: 0 mm
TB Skin Test: NEGATIVE
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
## 2022-07-06 ENCOUNTER — Encounter: Payer: Self-pay | Admitting: Physical Therapy

## 2022-07-06 ENCOUNTER — Ambulatory Visit: Payer: 59 | Admitting: Physical Therapy

## 2022-07-06 DIAGNOSIS — M6281 Muscle weakness (generalized): Secondary | ICD-10-CM | POA: Diagnosis not present

## 2022-07-06 DIAGNOSIS — M79602 Pain in left arm: Secondary | ICD-10-CM | POA: Diagnosis not present

## 2022-07-06 DIAGNOSIS — M542 Cervicalgia: Secondary | ICD-10-CM | POA: Diagnosis not present

## 2022-07-06 DIAGNOSIS — R293 Abnormal posture: Secondary | ICD-10-CM
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
## 2022-07-08 ENCOUNTER — Ambulatory Visit: Payer: 59 | Admitting: Physical Therapy

## 2022-07-08 ENCOUNTER — Encounter: Payer: Self-pay | Admitting: Physical Therapy

## 2022-07-08 DIAGNOSIS — M6281 Muscle weakness (generalized): Secondary | ICD-10-CM | POA: Diagnosis not present

## 2022-07-08 DIAGNOSIS — R293 Abnormal posture: Secondary | ICD-10-CM | POA: Diagnosis not present

## 2022-07-08 DIAGNOSIS — M542 Cervicalgia: Secondary | ICD-10-CM | POA: Diagnosis not present

## 2022-07-08 DIAGNOSIS — M79602 Pain in left arm: Secondary | ICD-10-CM
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
## 2022-07-13 ENCOUNTER — Encounter: Payer: Self-pay | Admitting: Physical Therapy

## 2022-07-13 ENCOUNTER — Ambulatory Visit: Payer: 59 | Attending: Primary Care | Admitting: Physical Therapy

## 2022-07-13 DIAGNOSIS — R293 Abnormal posture: Secondary | ICD-10-CM | POA: Insufficient documentation

## 2022-07-13 DIAGNOSIS — M542 Cervicalgia: Secondary | ICD-10-CM | POA: Diagnosis not present

## 2022-07-13 DIAGNOSIS — M6281 Muscle weakness (generalized): Secondary | ICD-10-CM | POA: Diagnosis not present

## 2022-07-13 DIAGNOSIS — M79602 Pain in left arm: Secondary | ICD-10-CM | POA: Insufficient documentation
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
## 2022-07-15 ENCOUNTER — Ambulatory Visit: Payer: 59 | Admitting: Physical Therapy

## 2022-07-25 ENCOUNTER — Telehealth: Payer: 59 | Admitting: Physician Assistant

## 2022-07-25 ENCOUNTER — Ambulatory Visit (INDEPENDENT_AMBULATORY_CARE_PROVIDER_SITE_OTHER): Payer: Self-pay | Admitting: *Deleted

## 2022-07-25 DIAGNOSIS — H1033 Unspecified acute conjunctivitis, bilateral: Secondary | ICD-10-CM

## 2022-07-25 MED ORDER — POLYMYXIN B-TRIMETHOPRIM 10000-0.1 UNIT/ML-% OP SOLN
OPHTHALMIC | 0 refills | Status: DC
Start: 2022-07-25 — End: 2023-05-08

## 2022-07-25 NOTE — Telephone Encounter (Signed)
Message from Dawson sent at 07/25/2022  2:00 PM EDT  Summary: possible pink eye in both eyes / Requesting Rx   Pt stated possible pink eye in both eyes from grandchild. Slight headache eyes are red and crusty.  Pt declined to schedule and is requesting Rx be sent in for her.  Seeking clinical advice.          Call History   Type Contact Phone/Fax User  07/25/2022 01:57 PM EDT Phone ([address] [place], [place] B (Self) [phone] Judie Petit) [place], Darlina Rumpf

## 2022-07-25 NOTE — Telephone Encounter (Signed)
Third attempt to reach pt, left VM to call back each time. Routing to practice for provider's resolution per protocol.

## 2022-07-25 NOTE — Progress Notes (Signed)
Virtual Visit Consent   Cynthia Parsons, you are scheduled for a virtual visit with a [facility] provider today. Just as with appointments in the office, your consent must be obtained to participate. Your consent will be active for this visit and any virtual visit you may have with one of our providers in the next 365 days. If you have a MyChart account, a copy of this consent can be sent to you electronically.  As this is a virtual visit, video technology does not allow for your provider to perform a traditional examination. This may limit your provider's ability to fully assess your condition. If your provider identifies any concerns that need to be evaluated in person or the need to arrange testing (such as labs, EKG, etc.), we will make arrangements to do so. Although advances in technology are sophisticated, we cannot ensure that it will always work on either your end or our end. If the connection with a video visit is poor, the visit may have to be switched to a telephone visit. With either a video or telephone visit, we are not always able to ensure that we have a secure connection.  By engaging in this virtual visit, you consent to the provision of healthcare and authorize for your insurance to be billed (if applicable) for the services provided during this visit. Depending on your insurance coverage, you may receive a charge related to this service.  I need to obtain your verbal consent now. Are you willing to proceed with your visit today? [name] has provided verbal consent on 07/25/2022 for a virtual visit (video or telephone). Cynthia Parsons, [place]  Date: 07/25/2022 5:23 PM  Virtual Visit via Video Note   I, Cynthia Parsons, connected with  [name]  ([id number], 10-Jun-1970) on 07/25/22 at  5:15 PM EDT by a video-enabled telemedicine application and verified that I am speaking with the correct person using two identifiers.  Location: Patient: Virtual  Visit Location Patient: Home Provider: Virtual Visit Location Provider: Home Office   I discussed the limitations of evaluation and management by telemedicine and the availability of in person appointments. The patient expressed understanding and agreed to proceed.    History of Present Illness: Cynthia Parsons is a 52 y.o. who identifies as a female who was assigned female at birth, and is being seen today for concern of pink eye.  Notes symptoms starting Friday of last week with irritation of eyes bilaterally, subsequently developing purulent drainage from the eyes and matting. Both grandchildren treated for pink eye last week. Denies chills. Low-grade fever. Denies URI symptoms.  HPI: HPI  Problems:  Patient Active Problem List   Diagnosis Date Noted   Acute respiratory failure due to COVID-19 [facility]) 12/29/2018   Diarrhea 12/29/2018   Microcytic anemia 12/29/2018   COVID-19 12/28/2018    Allergies:  Allergies  Allergen Reactions   Penicillins Hives    Did it involve swelling of the face/tongue/throat, SOB, or low BP? yes Did it involve sudden or severe rash/hives, skin peeling, or any reaction on the inside of your mouth or nose? yes Did you need to seek medical attention at a hospital or doctor's office? yes When did it last happen?  in her 30s If all above answers are "NO", may proceed with cephalosporin use.    Medications:  Current Outpatient Medications:    trimethoprim-polymyxin b (POLYTRIM) ophthalmic solution, Apply 1-2 drops into affected eye QID x 5 days., Disp: 10 mL, Rfl:  0  Observations/Objective: Patient is well-developed, well-nourished in no acute distress.  Resting comfortably at home.  Head is normocephalic, atraumatic.  No labored breathing. Speech is clear and coherent with logical content.  Patient is alert and oriented at baseline.  Bilateral conjunctival injection noted with mild drainage. No lid swelling. Pupils are equal and round.   Assessment  and Plan: 1. Acute bacterial conjunctivitis of both eyes - trimethoprim-polymyxin b (POLYTRIM) ophthalmic solution; Apply 1-2 drops into affected eye QID x 5 days.  Dispense: 10 mL; Refill: 0  Supportive measures and OTC medications reviewed. Polytrim per orders. Strict in-person follow-up precautions reviewed.   Follow Up Instructions: I discussed the assessment and treatment plan with the patient. The patient was provided an opportunity to ask questions and all were answered. The patient agreed with the plan and demonstrated an understanding of the instructions.  A copy of instructions were sent to the patient via MyChart unless otherwise noted below.   The patient was advised to call back or seek an in-person evaluation if the symptoms worsen or if the condition fails to improve as anticipated.  Time:  I spent 10 minutes with the patient via telehealth technology discussing the above problems/concerns.    Cynthia Climes, PA-C

## 2022-07-25 NOTE — Telephone Encounter (Signed)
   Chief Complaint: Pink eye Symptoms: swollen red crusty, blurred vision Frequency: Friday Pertinent Negatives: Patient denies fever Disposition: [] ED /[] Urgent Care (no appt availability in office) / [] Appointment(In office/virtual)/ [x]  [facility]/ [] Home Care/ [] Refused Recommended Disposition /[] [facility]/ []  Follow-up with PCP Additional Notes: Pt has 2 grandchildren with pink eye that she was with last week. Friday pt started developing redness, pain and itching. Pt prefers to be seen tonight for care. VV appt made.   Summary: possible pink eye in both eyes / Requesting Rx     Pt stated possible pink eye in both eyes from grandchild. Slight headache eyes are red and crusty.  Pt declined to schedule and is requesting Rx be sent in for her.  Seeking clinical advice.       Reason for Disposition  MODERATE eye pain or discomfort (e.g., interferes with normal activities or awakens from sleep; more than mild)  Answer Assessment - Initial Assessment Questions 1. LOCATION: Location: "What's red, the eyeball or the outer eyelids?" (Note: when callers say the eye is red, they usually mean the sclera is red)       Eyeballs  2. REDNESS OF SCLERA: "Is the redness in one or both eyes?" "When did the redness start?"      yes 3. ONSET: "When did the eye become red?" (e.g., hours, days)      Friday evening 4. EYELIDS: "Are the eyelids red or swollen?" If Yes, ask: "How much?"      Swollen 5. VISION: "Is there any difficulty seeing clearly?"      blurry 6. ITCHING: "Does it feel itchy?" If so ask: "How bad is it" (e.g., Scale 1-10; or mild, moderate, severe)     yes 7. PAIN: "Is there any pain? If Yes, ask: "How bad is it?" (e.g., Scale 1-10; or mild, moderate, severe)   Yes   9. CAUSE: "What do you think is causing the redness?"     Pink eye 10. OTHER SYMPTOMS: "Do you have any other symptoms?" (e.g., fever, runny nose, cough, vomiting)       Crust  pus  Protocols used: Eye - Red Without Pus-A-AH

## 2022-07-25 NOTE — Telephone Encounter (Signed)
Contacted pt to schedule an appt pt didn't answer lvm  ?

## 2022-07-25 NOTE — Telephone Encounter (Signed)
Attempted to return her call.   Left a voicemail to call back to discuss symptoms with a nurse. 

## 2022-07-25 NOTE — Patient Instructions (Signed)
Cynthia Parsons, thank you for joining [name], PA-C for today's virtual visit.  While this provider is not your primary care provider (PCP), if your PCP is located in our provider database this encounter information will be shared with them immediately following your visit.   A [facility] MyChart account gives you access to today's visit and all your visits, tests, and labs performed at [facility] " click here if you don't have a [facility] MyChart account or go to [email or web address]  Consent: (Patient) Cynthia Parsons provided verbal consent for this virtual visit at the beginning of the encounter.  Current Medications:  Current Outpatient Medications:    blood glucose meter kit and supplies KIT, Dispense based on patient and insurance preference. Use up to four times daily as directed. (FOR ICD-9 250.00, 250.01). (Patient not taking: Reported on 04/18/2022), Disp: 1 each, Rfl: 0   Dulaglutide (TRULICITY) 0.75 MG/0.5ML SOPN, Inject 0.75 mg into the skin once a week. (Patient not taking: Reported on 06/28/2022), Disp: 6 mL, Rfl: 1   glucose blood test strip, Use as instructed (Patient not taking: Reported on 04/18/2022), Disp: 100 each, Rfl: 12   ibuprofen (ADVIL) 800 MG tablet, Take 800 mg by mouth every 8 (eight) hours as needed for moderate pain. (Patient not taking: Reported on 06/28/2022), Disp: , Rfl:    Iron, Ferrous Sulfate, 325 (65 Fe) MG TABS, Take 325 mg by mouth 1 day or 1 dose for 1 dose., Disp: 90 tablet, Rfl: 1   methocarbamol (ROBAXIN) 500 MG tablet, Take 1 tablet (500 mg total) by mouth every 8 (eight) hours as needed for muscle spasms. (Patient not taking: Reported on 06/28/2022), Disp: 90 tablet, Rfl: 1   naproxen sodium (ANAPROX DS) 550 MG tablet, Take 1 tablet (550 mg total) by mouth 2 (two) times daily with a meal., Disp: 60 tablet, Rfl: 1   Medications ordered in this encounter:  No orders of the defined types were placed in this  encounter.    *If you need refills on other medications prior to your next appointment, please contact your pharmacy*  Follow-Up: Call back or seek an in-person evaluation if the symptoms worsen or if the condition fails to improve as anticipated.   Virtual Care [phone]  Other Instructions  Based on what you have shared with me it looks like you have conjunctivitis.  Conjunctivitis is a common inflammatory or infectious condition of the eye that is often referred to as "pink eye".  In most cases it is contagious (viral or bacterial). However, not all conjunctivitis requires antibiotics (ex. Allergic).  We have made appropriate suggestions for you based upon your presentation.  I have prescribed Polytrim Ophthalmic drops 1-2 drops 4 times a day times 5 days  Pink eye can be highly contagious.  It is typically spread through direct contact with secretions, or contaminated objects or surfaces that one may have touched.  Strict handwashing is suggested with soap and water is urged.  If not available, use alcohol based had sanitizer.  Avoid unnecessary touching of the eye.  If you wear contact lenses, you will need to refrain from wearing them until you see no white discharge from the eye for at least 24 hours after being on medication.  You should see symptom improvement in 1-2 days after starting the medication regimen.  Call us if symptoms are not improved in 1-2 days.  Home Care: Wash your hands often! Do not wear your contacts until you  complete your treatment plan. Avoid sharing towels, bed linen, personal items with a person who has pink eye. See attention for anyone in your home with similar symptoms.  Get Help Right Away If: Your symptoms do not improve. You develop blurred or loss of vision. Your symptoms worsen (increased discharge, pain or redness)    If you have been instructed to have an in-person evaluation today at a local Urgent Care facility, please use  the link below. It will take you to a list of all of our available [facility], including address, phone number and hours of operation. Please do not delay care.  [facility]  If you or a family member do not have a primary care provider, use the link below to schedule a visit and establish care. When you choose a [facility] primary care physician or advanced practice provider, you gain a long-term partner in health. Find a Primary Care Provider  Learn more about [facility]'s in-office and virtual care options:  - Get Care Now

## 2022-07-25 NOTE — Telephone Encounter (Signed)
Second attempt to reach pt, left VM to call back. 

## 2022-07-26 NOTE — Telephone Encounter (Signed)
Pt had virtual visit.

## 2022-07-27 ENCOUNTER — Encounter: Payer: Self-pay | Admitting: Physical Therapy

## 2022-07-27 ENCOUNTER — Ambulatory Visit: Payer: 59 | Admitting: Physical Therapy

## 2022-07-27 DIAGNOSIS — M6281 Muscle weakness (generalized): Secondary | ICD-10-CM | POA: Diagnosis not present

## 2022-07-27 DIAGNOSIS — M79602 Pain in left arm: Secondary | ICD-10-CM | POA: Diagnosis not present

## 2022-07-27 DIAGNOSIS — R293 Abnormal posture: Secondary | ICD-10-CM | POA: Diagnosis not present

## 2022-07-27 DIAGNOSIS — M542 Cervicalgia: Secondary | ICD-10-CM | POA: Diagnosis not present
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
## 2022-07-29 ENCOUNTER — Encounter: Payer: Self-pay | Admitting: Physical Therapy

## 2022-07-29 ENCOUNTER — Ambulatory Visit: Payer: 59 | Admitting: Physical Therapy

## 2022-07-29 DIAGNOSIS — M79602 Pain in left arm: Secondary | ICD-10-CM

## 2022-07-29 DIAGNOSIS — M6281 Muscle weakness (generalized): Secondary | ICD-10-CM

## 2022-07-29 DIAGNOSIS — M542 Cervicalgia: Secondary | ICD-10-CM

## 2022-07-29 DIAGNOSIS — R293 Abnormal posture: Secondary | ICD-10-CM
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
## 2022-08-09 ENCOUNTER — Ambulatory Visit: Payer: 59 | Attending: Primary Care | Admitting: Physical Therapy

## 2022-08-09 ENCOUNTER — Encounter: Payer: Self-pay | Admitting: Physical Therapy

## 2022-08-09 DIAGNOSIS — M6281 Muscle weakness (generalized): Secondary | ICD-10-CM | POA: Insufficient documentation

## 2022-08-09 DIAGNOSIS — R293 Abnormal posture: Secondary | ICD-10-CM | POA: Insufficient documentation

## 2022-08-09 DIAGNOSIS — M542 Cervicalgia: Secondary | ICD-10-CM | POA: Insufficient documentation

## 2022-08-09 DIAGNOSIS — M79602 Pain in left arm: Secondary | ICD-10-CM | POA: Insufficient documentation
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
## 2022-08-15 ENCOUNTER — Encounter: Payer: Self-pay | Admitting: Physical Therapy

## 2022-08-15 ENCOUNTER — Ambulatory Visit: Payer: 59 | Admitting: Physical Therapy

## 2022-08-15 DIAGNOSIS — M542 Cervicalgia: Secondary | ICD-10-CM

## 2022-08-15 DIAGNOSIS — R293 Abnormal posture: Secondary | ICD-10-CM

## 2022-08-15 DIAGNOSIS — M79602 Pain in left arm: Secondary | ICD-10-CM | POA: Diagnosis not present

## 2022-08-15 DIAGNOSIS — M6281 Muscle weakness (generalized): Secondary | ICD-10-CM
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
## 2022-08-17 ENCOUNTER — Encounter: Payer: Self-pay | Admitting: Physical Therapy

## 2022-08-17 ENCOUNTER — Ambulatory Visit: Payer: 59 | Admitting: Physical Therapy

## 2022-08-17 DIAGNOSIS — M79602 Pain in left arm: Secondary | ICD-10-CM | POA: Diagnosis not present

## 2022-08-17 DIAGNOSIS — M542 Cervicalgia: Secondary | ICD-10-CM

## 2022-08-17 DIAGNOSIS — M6281 Muscle weakness (generalized): Secondary | ICD-10-CM | POA: Diagnosis not present

## 2022-08-17 DIAGNOSIS — R293 Abnormal posture: Secondary | ICD-10-CM | POA: Diagnosis not present
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
## 2022-08-23 ENCOUNTER — Ambulatory Visit: Payer: 59 | Admitting: Physical Therapy

## 2022-08-23 ENCOUNTER — Encounter: Payer: Self-pay | Admitting: Physical Therapy

## 2022-08-23 DIAGNOSIS — M542 Cervicalgia: Secondary | ICD-10-CM | POA: Diagnosis not present

## 2022-08-23 DIAGNOSIS — M79602 Pain in left arm: Secondary | ICD-10-CM | POA: Diagnosis not present

## 2022-08-23 DIAGNOSIS — M6281 Muscle weakness (generalized): Secondary | ICD-10-CM | POA: Diagnosis not present

## 2022-08-23 DIAGNOSIS — R293 Abnormal posture: Secondary | ICD-10-CM
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
## 2022-08-26 ENCOUNTER — Encounter: Payer: Self-pay | Admitting: Physical Therapy

## 2022-08-26 ENCOUNTER — Ambulatory Visit: Payer: 59 | Admitting: Physical Therapy

## 2022-08-26 DIAGNOSIS — M6281 Muscle weakness (generalized): Secondary | ICD-10-CM | POA: Diagnosis not present

## 2022-08-26 DIAGNOSIS — M79602 Pain in left arm: Secondary | ICD-10-CM | POA: Diagnosis not present

## 2022-08-26 DIAGNOSIS — M542 Cervicalgia: Secondary | ICD-10-CM

## 2022-08-26 DIAGNOSIS — R293 Abnormal posture: Secondary | ICD-10-CM
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
## 2022-08-31 ENCOUNTER — Encounter: Payer: Self-pay | Admitting: Physical Therapy

## 2022-08-31 ENCOUNTER — Ambulatory Visit: Payer: 59 | Admitting: Physical Therapy

## 2022-08-31 DIAGNOSIS — M542 Cervicalgia: Secondary | ICD-10-CM | POA: Diagnosis not present

## 2022-08-31 DIAGNOSIS — R293 Abnormal posture: Secondary | ICD-10-CM

## 2022-08-31 DIAGNOSIS — M79602 Pain in left arm: Secondary | ICD-10-CM

## 2022-08-31 DIAGNOSIS — M6281 Muscle weakness (generalized): Secondary | ICD-10-CM | POA: Diagnosis not present
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
## 2022-09-02 ENCOUNTER — Ambulatory Visit: Payer: 59 | Admitting: Physical Therapy

## 2022-09-06 ENCOUNTER — Ambulatory Visit (INDEPENDENT_AMBULATORY_CARE_PROVIDER_SITE_OTHER): Payer: 59 | Admitting: Family Medicine

## 2022-09-06 DIAGNOSIS — M25519 Pain in unspecified shoulder: Secondary | ICD-10-CM

## 2022-09-06 NOTE — Progress Notes (Signed)
Pt erroneously scheduled with me. She is planning to stay with PCP but was coming for a sports medicine visit about shoulder

## 2022-09-06 NOTE — Progress Notes (Deleted)
     New patient visit   Patient: Cynthia Parsons   DOB: [birth year]   52 y.o. Female  MRN: [id number] Visit Date: 09/06/2022  Today's healthcare provider: Charlton Amor, DO   No chief complaint on file.   SUBJECTIVE   No chief complaint on file.  HPI    Review of Systems     No outpatient medications have been marked as taking for the 09/06/22 encounter (Appointment) with Charlton Amor, DO.    OBJECTIVE    There were no vitals taken for this visit.  Physical Exam   {Show previous labs (optional):23736}    ASSESSMENT & PLAN    Problem List Items Addressed This Visit   None   No follow-ups on file.      No orders of the defined types were placed in this encounter.   No orders of the defined types were placed in this encounter.    Charlton Amor, DO  [facility] at [facility] [phone] (phone) [phone] (fax)  [facility]

## 2022-09-07 ENCOUNTER — Encounter: Payer: Self-pay | Admitting: Physical Therapy

## 2022-09-07 ENCOUNTER — Ambulatory Visit: Payer: 59 | Admitting: Physical Therapy

## 2022-09-07 DIAGNOSIS — M79602 Pain in left arm: Secondary | ICD-10-CM

## 2022-09-07 DIAGNOSIS — M542 Cervicalgia: Secondary | ICD-10-CM | POA: Diagnosis not present

## 2022-09-07 DIAGNOSIS — R293 Abnormal posture: Secondary | ICD-10-CM

## 2022-09-07 DIAGNOSIS — M6281 Muscle weakness (generalized): Secondary | ICD-10-CM | POA: Diagnosis not present
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
## 2022-09-12 DIAGNOSIS — U071 COVID-19: Secondary | ICD-10-CM | POA: Diagnosis not present

## 2022-09-12 DIAGNOSIS — J392 Other diseases of pharynx: Secondary | ICD-10-CM | POA: Diagnosis not present

## 2022-09-14 ENCOUNTER — Encounter: Payer: 59 | Admitting: Sports Medicine

## 2022-09-14 ENCOUNTER — Telehealth (INDEPENDENT_AMBULATORY_CARE_PROVIDER_SITE_OTHER): Payer: 59 | Admitting: Primary Care

## 2022-09-14 ENCOUNTER — Ambulatory Visit (INDEPENDENT_AMBULATORY_CARE_PROVIDER_SITE_OTHER): Payer: Self-pay | Admitting: Primary Care

## 2022-09-14 ENCOUNTER — Other Ambulatory Visit: Payer: Self-pay

## 2022-09-14 ENCOUNTER — Ambulatory Visit (INDEPENDENT_AMBULATORY_CARE_PROVIDER_SITE_OTHER): Payer: Self-pay

## 2022-09-14 DIAGNOSIS — U071 COVID-19: Secondary | ICD-10-CM

## 2022-09-14 MED ORDER — PAXLOVID (150/100) 10 X 150 MG & 10 X 100MG PO TBPK
ORAL_TABLET | ORAL | 0 refills | Status: DC
  Filled 2022-09-14: qty 30, 30d supply, fill #0

## 2022-09-14 NOTE — Telephone Encounter (Signed)
Patient called in stated she was diagnosed with Covid on Monday and she is scared as she was hospitalized the last time she had it. Patient is asking for a medication that she can take other than Paxlovid as it cost her $1300. Please f/u with patient     Chief Complaint: Tested positive COVID Monday. Prescribed Paxlovid, but cannot afford to get it. Fatigue, headache, cough, fever. Concerned because she has been hospitalized with COVID in the past. Symptoms: Above Frequency: Monday Pertinent Negatives: Patient denies  Disposition: [] ED /[] Urgent Care (no appt availability in office) / [x] Appointment(In office/virtual)/ []  [facility]/ [] Home Care/ [] Refused Recommended Disposition /[] [facility]/ []  Follow-up with PCP Additional Notes: Pt. Agrees with VV.  Reason for Disposition  MILD difficulty breathing (e.g., minimal/no SOB at rest, SOB with walking, pulse <100)  Answer Assessment - Initial Assessment Questions 1. COVID-19 DIAGNOSIS: "How do you know that you have COVID?" (e.g., positive lab test or self-test, diagnosed by doctor or NP/PA, symptoms after exposure).     Home test/UC 2. COVID-19 EXPOSURE: "Was there any known exposure to COVID before the symptoms began?" CDC Definition of close contact: within 6 feet (2 meters) for a total of 15 minutes or more over a 24-hour period.      Yes 3. ONSET: "When did the COVID-19 symptoms start?"      Monday 4. WORST SYMPTOM: "What is your worst symptom?" (e.g., cough, fever, shortness of breath, muscle aches)     Fatigue, headache, fever, cough 5. COUGH: "Do you have a cough?" If Yes, ask: "How bad is the cough?"       Yes 6. FEVER: "Do you have a fever?" If Yes, ask: "What is your temperature, how was it measured, and when did it start?"     Yes 7. RESPIRATORY STATUS: "Describe your breathing?" (e.g., normal; shortness of breath, wheezing, unable to speak)      No 8. BETTER-SAME-WORSE: "Are you getting better,  staying the same or getting worse compared to yesterday?"  If getting worse, ask, "In what way?"     Better 9. OTHER SYMPTOMS: "Do you have any other symptoms?"  (e.g., chills, fatigue, headache, loss of smell or taste, muscle pain, sore throat)     Nasal congestion 10. HIGH RISK DISEASE: "Do you have any chronic medical problems?" (e.g., asthma, heart or lung disease, weak immune system, obesity, etc.)       Yes 11. VACCINE: "Have you had the COVID-19 vaccine?" If Yes, ask: "Which one, how many shots, when did you get it?"       N/a 12. PREGNANCY: "Is there any chance you are pregnant?" "When was your last menstrual period?"       No 13. O2 SATURATION MONITOR:  "Do you use an oxygen saturation monitor (pulse oximeter) at home?" If Yes, ask "What is your reading (oxygen level) today?" "What is your usual oxygen saturation reading?" (e.g., 95%)       No  Protocols used: Coronavirus (COVID-19) Diagnosed or Suspected-A-AH
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
## 2022-09-15 ENCOUNTER — Other Ambulatory Visit: Payer: Self-pay

## 2022-09-22 ENCOUNTER — Telehealth: Payer: Self-pay | Admitting: Family Medicine

## 2022-09-22 NOTE — Telephone Encounter (Signed)
Patient called in and stated she had a covid test done at an urgent care on Sunday, 09/11/2022, where she tested positive. Then, patient had a virtual visit with PCP, Gwinda Passe, on 09/14/2022 in regards to testing positive for Covid. Patient stated she was prescribed Paxlovid and stated she has finished the medication and that she is not sick anymore. Per patient, she needs a work note to return back to work due to having covid, also her work is requesting her to have a negative test done before she can return back to work. Please advise & contact patient back @ # [phone]

## 2022-09-22 NOTE — Telephone Encounter (Signed)
Will forward to provider  

## 2022-09-23 ENCOUNTER — Ambulatory Visit (INDEPENDENT_AMBULATORY_CARE_PROVIDER_SITE_OTHER): Payer: 59 | Admitting: Sports Medicine

## 2022-09-23 ENCOUNTER — Ambulatory Visit (INDEPENDENT_AMBULATORY_CARE_PROVIDER_SITE_OTHER): Payer: 59

## 2022-09-23 DIAGNOSIS — M5412 Radiculopathy, cervical region: Secondary | ICD-10-CM

## 2022-09-23 DIAGNOSIS — M542 Cervicalgia: Secondary | ICD-10-CM | POA: Diagnosis not present

## 2022-09-23 DIAGNOSIS — M47812 Spondylosis without myelopathy or radiculopathy, cervical region: Secondary | ICD-10-CM | POA: Diagnosis not present

## 2022-09-23 MED ORDER — CYCLOBENZAPRINE HCL 10 MG PO TABS
ORAL_TABLET | ORAL | 0 refills | Status: DC
Start: 2022-09-23 — End: 2023-05-08

## 2022-09-23 MED ORDER — PREDNISONE 50 MG PO TABS
ORAL_TABLET | ORAL | 0 refills | Status: DC
Start: 2022-09-23 — End: 2022-11-03
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
## 2022-09-26 ENCOUNTER — Ambulatory Visit (INDEPENDENT_AMBULATORY_CARE_PROVIDER_SITE_OTHER): Payer: 59 | Admitting: Primary Care

## 2022-09-26 ENCOUNTER — Ambulatory Visit (INDEPENDENT_AMBULATORY_CARE_PROVIDER_SITE_OTHER): Payer: 59

## 2022-09-26 DIAGNOSIS — R0989 Other specified symptoms and signs involving the circulatory and respiratory systems: Secondary | ICD-10-CM

## 2022-09-27 ENCOUNTER — Ambulatory Visit: Payer: 59 | Admitting: Rehabilitative and Restorative Service Providers"

## 2022-09-28 ENCOUNTER — Encounter (INDEPENDENT_AMBULATORY_CARE_PROVIDER_SITE_OTHER): Payer: Self-pay | Admitting: Primary Care

## 2022-09-28 LAB — COVID-19, FLU A+B AND RSV
Influenza A, NAA: NOT DETECTED
Influenza B, NAA: NOT DETECTED
RSV, NAA: NOT DETECTED
SARS-CoV-2, NAA: NOT DETECTED

## 2022-09-28 NOTE — Telephone Encounter (Signed)
Cynthia Parsons pt covid test was negative please provider release back to work note

## 2022-09-28 NOTE — Telephone Encounter (Signed)
done

## 2022-10-04 ENCOUNTER — Other Ambulatory Visit: Payer: Self-pay

## 2022-10-04 ENCOUNTER — Ambulatory Visit: Payer: 59 | Attending: Primary Care | Admitting: Physical Therapy

## 2022-10-04 ENCOUNTER — Encounter: Payer: Self-pay | Admitting: Physical Therapy

## 2022-10-04 DIAGNOSIS — M542 Cervicalgia: Secondary | ICD-10-CM | POA: Diagnosis present

## 2022-10-04 DIAGNOSIS — M6281 Muscle weakness (generalized): Secondary | ICD-10-CM | POA: Diagnosis present

## 2022-10-04 DIAGNOSIS — M5412 Radiculopathy, cervical region: Secondary | ICD-10-CM | POA: Diagnosis present

## 2022-10-04 DIAGNOSIS — M79602 Pain in left arm: Secondary | ICD-10-CM | POA: Insufficient documentation

## 2022-10-04 DIAGNOSIS — R293 Abnormal posture: Secondary | ICD-10-CM | POA: Insufficient documentation
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
## 2022-10-06 ENCOUNTER — Encounter: Payer: Self-pay | Admitting: Physical Therapy

## 2022-10-06 ENCOUNTER — Ambulatory Visit: Payer: 59 | Admitting: Physical Therapy

## 2022-10-06 DIAGNOSIS — M6281 Muscle weakness (generalized): Secondary | ICD-10-CM | POA: Diagnosis not present

## 2022-10-06 DIAGNOSIS — M5412 Radiculopathy, cervical region: Secondary | ICD-10-CM | POA: Diagnosis not present

## 2022-10-06 DIAGNOSIS — R293 Abnormal posture: Secondary | ICD-10-CM

## 2022-10-06 DIAGNOSIS — M79602 Pain in left arm: Secondary | ICD-10-CM | POA: Diagnosis not present

## 2022-10-06 DIAGNOSIS — M542 Cervicalgia: Secondary | ICD-10-CM

## 2022-10-11 ENCOUNTER — Ambulatory Visit: Payer: 59 | Attending: Primary Care | Admitting: Physical Therapy

## 2022-10-11 ENCOUNTER — Encounter: Payer: Self-pay | Admitting: Physical Therapy

## 2022-10-11 DIAGNOSIS — M542 Cervicalgia: Secondary | ICD-10-CM | POA: Insufficient documentation

## 2022-10-11 DIAGNOSIS — M6281 Muscle weakness (generalized): Secondary | ICD-10-CM | POA: Insufficient documentation

## 2022-10-11 DIAGNOSIS — R293 Abnormal posture: Secondary | ICD-10-CM | POA: Insufficient documentation

## 2022-10-11 DIAGNOSIS — M79602 Pain in left arm: Secondary | ICD-10-CM | POA: Insufficient documentation
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
## 2022-10-13 ENCOUNTER — Ambulatory Visit: Payer: 59 | Admitting: Physical Therapy

## 2022-10-20 ENCOUNTER — Ambulatory Visit: Payer: 59 | Admitting: Physical Therapy

## 2022-10-20 ENCOUNTER — Encounter: Payer: Self-pay | Admitting: Physical Therapy

## 2022-10-20 DIAGNOSIS — R293 Abnormal posture: Secondary | ICD-10-CM

## 2022-10-20 DIAGNOSIS — M79602 Pain in left arm: Secondary | ICD-10-CM

## 2022-10-20 DIAGNOSIS — M6281 Muscle weakness (generalized): Secondary | ICD-10-CM

## 2022-10-20 DIAGNOSIS — M542 Cervicalgia: Secondary | ICD-10-CM
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
## 2022-10-21 ENCOUNTER — Encounter: Payer: Self-pay | Admitting: Physical Therapy

## 2022-10-21 ENCOUNTER — Ambulatory Visit: Payer: 59 | Admitting: Physical Therapy

## 2022-10-21 DIAGNOSIS — M6281 Muscle weakness (generalized): Secondary | ICD-10-CM | POA: Diagnosis not present

## 2022-10-21 DIAGNOSIS — M542 Cervicalgia: Secondary | ICD-10-CM

## 2022-10-21 DIAGNOSIS — M79602 Pain in left arm: Secondary | ICD-10-CM

## 2022-10-21 DIAGNOSIS — R293 Abnormal posture: Secondary | ICD-10-CM | POA: Diagnosis not present
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
## 2022-10-23 ENCOUNTER — Ambulatory Visit (INDEPENDENT_AMBULATORY_CARE_PROVIDER_SITE_OTHER): Payer: 59

## 2022-10-23 DIAGNOSIS — M5412 Radiculopathy, cervical region: Secondary | ICD-10-CM | POA: Diagnosis not present

## 2022-10-23 DIAGNOSIS — R29898 Other symptoms and signs involving the musculoskeletal system: Secondary | ICD-10-CM | POA: Diagnosis not present

## 2022-10-23 DIAGNOSIS — M4802 Spinal stenosis, cervical region: Secondary | ICD-10-CM | POA: Diagnosis not present

## 2022-10-23 DIAGNOSIS — M50121 Cervical disc disorder at C4-C5 level with radiculopathy: Secondary | ICD-10-CM | POA: Diagnosis not present

## 2022-10-23 DIAGNOSIS — M4722 Other spondylosis with radiculopathy, cervical region: Secondary | ICD-10-CM | POA: Diagnosis not present

## 2022-10-25 ENCOUNTER — Ambulatory Visit: Payer: 59 | Admitting: Physical Therapy

## 2022-10-28 ENCOUNTER — Ambulatory Visit: Payer: 59 | Admitting: Physical Therapy

## 2022-11-01 ENCOUNTER — Ambulatory Visit: Payer: 59 | Admitting: Physical Therapy

## 2022-11-03 ENCOUNTER — Encounter: Payer: Self-pay | Admitting: Sports Medicine

## 2022-11-03 ENCOUNTER — Ambulatory Visit (INDEPENDENT_AMBULATORY_CARE_PROVIDER_SITE_OTHER): Payer: 59

## 2022-11-03 ENCOUNTER — Ambulatory Visit (INDEPENDENT_AMBULATORY_CARE_PROVIDER_SITE_OTHER): Payer: 59 | Admitting: Sports Medicine

## 2022-11-03 DIAGNOSIS — M25562 Pain in left knee: Secondary | ICD-10-CM | POA: Diagnosis not present

## 2022-11-03 DIAGNOSIS — Z0189 Encounter for other specified special examinations: Secondary | ICD-10-CM

## 2022-11-03 DIAGNOSIS — M5412 Radiculopathy, cervical region: Secondary | ICD-10-CM | POA: Diagnosis not present

## 2022-11-03 DIAGNOSIS — M25561 Pain in right knee: Secondary | ICD-10-CM | POA: Diagnosis not present

## 2022-11-03 DIAGNOSIS — M1712 Unilateral primary osteoarthritis, left knee: Secondary | ICD-10-CM

## 2022-11-03 MED ORDER — ACETAMINOPHEN ER 650 MG PO TBCR: 650.0000 mg | EXTENDED_RELEASE_TABLET | Freq: Three times a day (TID) | ORAL | Status: DC | PRN

## 2022-11-03 NOTE — Assessment & Plan Note (Addendum)
Left C7 distribution radicular symptoms with also left trapezius and periscapular pain. MRI interpretation as above. She is doing better after prednisone, Flexeril. She can return as needed for this.  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

## 2022-11-03 NOTE — Progress Notes (Addendum)
    Procedures performed today:    None.  Independent interpretation of notes and tests performed by another provider:   I did personally review the MRI, it has not yet been read by radiology, there is multilevel disc disease from C4-C7, there are disc protrusions that do create some mild spinal cord compression and indentation of the thecal sac without myelomalacia.  Brief History, Exam, Impression, and Recommendations:    Primary osteoarthritis of left knee Reporting some pain left knee medial joint line as well as retropatellar. Exam is normal with the exception of tenderness around the patella and medial joint line, negative McMurray's sign, ligaments are stable. Adding x-rays, arthritis Tylenol, home PT, she will return to see me in about 6 weeks for injection if not better.  Radiculitis of left cervical region Left C7 distribution radicular symptoms with also left trapezius and periscapular pain. MRI interpretation as above. She is doing better after prednisone, Flexeril. She can return as needed for this.  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    ____________________________________________ Ihor Austin. Benjamin Stain, M.D., ABFM., CAQSM., AME. Primary Care and Sports Medicine [facility]  Adjunct Professor of Family Medicine  [place] [employer] [facility] [facility] [employer]  Restaurant manager, fast food

## 2022-11-03 NOTE — Assessment & Plan Note (Signed)
Reporting some pain left knee medial joint line as well as retropatellar. Exam is normal with the exception of tenderness around the patella and medial joint line, negative McMurray's sign, ligaments are stable. Adding x-rays, arthritis Tylenol, home PT, she will return to see me in about 6 weeks for injection if not better.

## 2022-11-04 ENCOUNTER — Ambulatory Visit: Payer: 59 | Admitting: Physical Therapy

## 2022-11-08 ENCOUNTER — Encounter: Payer: 59 | Admitting: Physical Therapy

## 2022-11-11 ENCOUNTER — Encounter: Payer: 59 | Admitting: Physical Therapy

## 2022-11-11 NOTE — Addendum Note (Signed)
Addended by: Monica Becton on: 11/11/2022 03:55 PM   Modules accepted: Orders

## 2022-11-15 ENCOUNTER — Encounter: Payer: 59 | Admitting: Physical Therapy

## 2022-11-15 ENCOUNTER — Encounter: Payer: Self-pay | Admitting: Sports Medicine

## 2022-11-18 ENCOUNTER — Encounter: Payer: 59 | Admitting: Physical Therapy

## 2022-11-21 NOTE — Discharge Instructions (Signed)

## 2022-11-22 ENCOUNTER — Inpatient Hospital Stay
Admission: RE | Admit: 2022-11-22 | Discharge: 2022-11-22 | Disposition: A | Discharge status: Home / Self Care | Payer: 59 | Source: Ambulatory Visit | Attending: Sports Medicine | Admitting: Sports Medicine

## 2022-12-06 NOTE — Discharge Instructions (Signed)

## 2022-12-07 ENCOUNTER — Ambulatory Visit
Admission: RE | Admit: 2022-12-07 | Discharge: 2022-12-07 | Disposition: A | Discharge status: Home / Self Care | Payer: 59 | Source: Ambulatory Visit | Attending: Sports Medicine | Admitting: Sports Medicine

## 2022-12-07 DIAGNOSIS — M50121 Cervical disc disorder at C4-C5 level with radiculopathy: Secondary | ICD-10-CM | POA: Diagnosis not present

## 2022-12-07 DIAGNOSIS — M5412 Radiculopathy, cervical region: Secondary | ICD-10-CM

## 2022-12-07 DIAGNOSIS — M50123 Cervical disc disorder at C6-C7 level with radiculopathy: Secondary | ICD-10-CM | POA: Diagnosis not present

## 2022-12-07 MED ORDER — IOPAMIDOL (ISOVUE-M 300) INJECTION 61%
1.0000 mL | Freq: Once | INTRAMUSCULAR | Status: AC
  Administered 2022-12-07: 1 mL via EPIDURAL

## 2022-12-07 MED ORDER — TRIAMCINOLONE ACETONIDE 40 MG/ML IJ SUSP (RADIOLOGY)
60.0000 mg | Freq: Once | INTRAMUSCULAR | Status: AC
  Administered 2022-12-07: 60 mg via EPIDURAL

## 2022-12-15 ENCOUNTER — Ambulatory Visit (INDEPENDENT_AMBULATORY_CARE_PROVIDER_SITE_OTHER): Payer: 59 | Admitting: Sports Medicine

## 2022-12-15 ENCOUNTER — Encounter: Payer: Self-pay | Admitting: Sports Medicine

## 2022-12-15 DIAGNOSIS — M1712 Unilateral primary osteoarthritis, left knee: Secondary | ICD-10-CM | POA: Diagnosis not present

## 2022-12-15 DIAGNOSIS — M5412 Radiculopathy, cervical region: Secondary | ICD-10-CM

## 2022-12-15 NOTE — Progress Notes (Signed)
    Procedures performed today:    None.  Independent interpretation of notes and tests performed by another provider:   None.  Brief History, Exam, Impression, and Recommendations:    Primary osteoarthritis of left knee Doing much better with arthritis from Tylenol and home PT, return to see me as needed.  Radiculitis of left cervical region Left C7 radicular symptoms, left trapezius, periscapular, she improved some before her injection, she had a great deal of procedural pain during the injection and does not report much relief. On exam she also has significant hyperalgesia to light palpation across the left trapezius. I do suspect there is an element of myofascial pain here as well. I did suggest continuing the Tylenol, home conditioning and we offered gabapentin, I explained the mechanism of gabapentin, she will think about it and let me know.    ____________________________________________ Ihor Austin. Benjamin Stain, M.D., ABFM., CAQSM., AME. Primary Care and Sports Medicine [facility]  Adjunct Professor of Family Medicine  [place] [employer] [employer] [facility] [employer]  Restaurant manager, fast food

## 2022-12-15 NOTE — Assessment & Plan Note (Signed)
Left C7 radicular symptoms, left trapezius, periscapular, she improved some before her injection, she had a great deal of procedural pain during the injection and does not report much relief. On exam she also has significant hyperalgesia to light palpation across the left trapezius. I do suspect there is an element of myofascial pain here as well. I did suggest continuing the Tylenol, home conditioning and we offered gabapentin, I explained the mechanism of gabapentin, she will think about it and let me know.

## 2022-12-15 NOTE — Assessment & Plan Note (Signed)
Doing much better with arthritis from Tylenol and home PT, return to see me as needed.

## 2023-02-01 ENCOUNTER — Other Ambulatory Visit: Payer: Self-pay

## 2023-02-01 ENCOUNTER — Emergency Department
Admission: EM | Admit: 2023-02-01 | Discharge: 2023-02-01 | Disposition: A | Discharge status: Home / Self Care | Payer: 59 | Attending: Emergency Medicine | Admitting: Emergency Medicine

## 2023-02-01 DIAGNOSIS — E119 Type 2 diabetes mellitus without complications: Secondary | ICD-10-CM | POA: Insufficient documentation

## 2023-02-01 DIAGNOSIS — Z20822 Contact with and (suspected) exposure to covid-19: Secondary | ICD-10-CM | POA: Insufficient documentation

## 2023-02-01 DIAGNOSIS — B9789 Other viral agents as the cause of diseases classified elsewhere: Secondary | ICD-10-CM | POA: Diagnosis not present

## 2023-02-01 DIAGNOSIS — J069 Acute upper respiratory infection, unspecified: Secondary | ICD-10-CM | POA: Insufficient documentation

## 2023-02-01 DIAGNOSIS — J101 Influenza due to other identified influenza virus with other respiratory manifestations: Secondary | ICD-10-CM | POA: Insufficient documentation

## 2023-02-01 DIAGNOSIS — Z8616 Personal history of COVID-19: Secondary | ICD-10-CM | POA: Insufficient documentation

## 2023-02-01 DIAGNOSIS — R059 Cough, unspecified: Secondary | ICD-10-CM | POA: Diagnosis not present

## 2023-02-01 LAB — RESP PANEL BY RT-PCR (RSV, FLU A&B, COVID)  RVPGX2
Influenza A by PCR: POSITIVE — AB
Influenza B by PCR: NEGATIVE
Resp Syncytial Virus by PCR: NEGATIVE
SARS Coronavirus 2 by RT PCR: NEGATIVE

## 2023-02-01 NOTE — Discharge Instructions (Signed)
You may check your MyChart for your flu results.  Please return for any new, worsening, or changing symptoms or other concerns.  It was a pleasure caring for you today.

## 2023-02-01 NOTE — ED Triage Notes (Signed)
"  Cough, congestion x 2 days" per patient. Patient is here accompanying grandchildren who were here for same symptoms and diagnosed with influenza A and discharged.

## 2023-02-01 NOTE — ED Provider Notes (Signed)
[facility] Provider Note    Event Date/Time   First MD Initiated Contact with Patient 02/01/23 1439     (approximate)   History   Cough   HPI  Cynthia Parsons is a 52 y.o. female with a past medical history of diabetes who presents today for evaluation of cough, nasal congestion, and bodyaches for the past 2 days.  She accompanied her 2 grandchildren to the emergency department who both tested positive for influenza, and patient subsequently decided to check herself and to be tested as well.  No chest pain or shortness of breath.  No abdominal pain, nausea, vomiting, diarrhea.  She has not taken anything for her symptoms today.  Patient Active Problem List   Diagnosis Date Noted   Primary osteoarthritis of left knee 11/03/2022   Radiculitis of left cervical region 09/23/2022   Acute respiratory failure due to COVID-19 [facility]) 12/29/2018   Diarrhea 12/29/2018   Microcytic anemia 12/29/2018   COVID-19 12/28/2018          Physical Exam   Triage Vital Signs: ED Triage Vitals  Encounter Vitals Group     BP      Systolic BP Percentile      Diastolic BP Percentile      Pulse      Resp      Temp      Temp src      SpO2      Weight      Height      Head Circumference      Peak Flow      Pain Score      Pain Loc      Pain Education      Exclude from Growth Chart     Most recent vital signs: Vitals:   02/01/23 1508  BP: 132/85  Pulse: 87  Resp: 18  Temp: 97.8 F (36.6 C)  SpO2: 94%    Physical Exam Vitals and nursing note reviewed.  Constitutional:      General: Awake and alert. No acute distress.    Appearance: Normal appearance. The patient is normal weight.  HENT:     Head: Normocephalic and atraumatic.     Mouth: Mucous membranes are moist.  Nasal congestion present Eyes:     General: PERRL. Normal EOMs        Right eye: No discharge.        Left eye: No discharge.     Conjunctiva/sclera: Conjunctivae normal.   Cardiovascular:     Rate and Rhythm: Normal rate and regular rhythm.     Pulses: Normal pulses.     Heart sounds: Normal heart sounds Pulmonary:     Effort: Pulmonary effort is normal. No respiratory distress.  Able to speak easily in complete sentences.    Breath sounds: Normal breath sounds.  Abdominal:     Abdomen is soft. There is no abdominal tenderness. No rebound or guarding. No distention. Musculoskeletal:        General: No swelling. Normal range of motion.     Cervical back: Normal range of motion and neck supple.  No nuchal rigidity Skin:    General: Skin is warm and dry.     Capillary Refill: Capillary refill takes less than 2 seconds.     Findings: No rash.  Neurological:     Mental Status: The patient is awake and alert.      ED Results / Procedures / Treatments   Labs (all  labs ordered are listed, but only abnormal results are displayed) Labs Reviewed  RESP PANEL BY RT-PCR (RSV, FLU A&B, COVID)  RVPGX2 - Abnormal; Notable for the following components:      Result Value   Influenza A by PCR POSITIVE (*)    All other components within normal limits     EKG     RADIOLOGY     PROCEDURES:  Critical Care performed:   Procedures   MEDICATIONS ORDERED IN ED: Medications - No data to display   IMPRESSION / MDM / ASSESSMENT AND PLAN / ED COURSE  I reviewed the triage vital signs and the nursing notes.   Differential diagnosis includes, but is not limited to, influenza, COVID, RSV, pneumonia, other URI.  Patient is awake and alert, nontoxic in appearance.  Patient accompanied her 2 grandchildren who checked into the emergency department and tested positive for influenza A.  Patient subsequently decided to check in herself to get tested because she would like to be tested as well.  She is otherwise well-appearing and has no complaints.  COVID, flu, RSV swab obtained.  However patient requested to be discharged prior to results returned, reporting  "I can find the results on MyChart."  We discussed return precautions and outpatient follow-up.  Patient understands and agrees with plan.  She was discharged in stable condition.   Patient's presentation is most consistent with acute complicated illness / injury requiring diagnostic workup.   Clinical Course as of 02/01/23 1744  Wed Feb 01, 2023  1519 Patient does not wish to wait for her results. [JP]    Clinical Course User Index [JP] Ab Leaming, Herb Grays, PA-C     FINAL CLINICAL IMPRESSION(S) / ED DIAGNOSES   Final diagnoses:  Viral URI with cough  Influenza A     Rx / DC Orders   ED Discharge Orders     None        Note:  This document was prepared using Dragon voice recognition software and may include unintentional dictation errors.   Jackelyn Hoehn, PA-C 02/01/23 1744    Trinna Post, MD 02/01/23 [phone]

## 2023-02-13 ENCOUNTER — Telehealth: Payer: Self-pay

## 2023-02-13 NOTE — Progress Notes (Signed)
 Transition Care Management Unsuccessful Follow-up Telephone Call  Date of discharge and from where:  02/01/2023 [facility]  Attempts:  1st Attempt  Reason for unsuccessful TCM follow-up call:  Left voice message  Bryna Razavi [facility]  [facility] Resource Care Guide Direct Dial: [phone]  Website: [email or web address]

## 2023-02-14 ENCOUNTER — Telehealth: Payer: Self-pay

## 2023-02-14 NOTE — Progress Notes (Signed)
 Transition Care Management Unsuccessful Follow-up Telephone Call  Date of discharge and from where:  02/01/2023 [facility]  Attempts:  2nd Attempt  Reason for unsuccessful TCM follow-up call:  Left voice message  Tericka Devincenzi [facility]  [facility] Resource Care Guide Direct Dial: [phone]  Website: [email or web address]

## 2023-03-08 ENCOUNTER — Encounter (INDEPENDENT_AMBULATORY_CARE_PROVIDER_SITE_OTHER): Payer: 59 | Admitting: Primary Care

## 2023-04-24 ENCOUNTER — Encounter: Admitting: Family Medicine

## 2023-05-08 ENCOUNTER — Encounter: Admitting: Family Medicine

## 2023-05-08 ENCOUNTER — Ambulatory Visit (INDEPENDENT_AMBULATORY_CARE_PROVIDER_SITE_OTHER): Admitting: Internal Medicine

## 2023-05-08 ENCOUNTER — Other Ambulatory Visit: Payer: Self-pay

## 2023-05-08 VITALS — BP 136/84 | HR 99 | Temp 98.2°F | Resp 16 | Ht 66.0 in | Wt 228.9 lb

## 2023-05-08 DIAGNOSIS — E1165 Type 2 diabetes mellitus with hyperglycemia: Secondary | ICD-10-CM

## 2023-05-08 DIAGNOSIS — Z1159 Encounter for screening for other viral diseases: Secondary | ICD-10-CM

## 2023-05-08 DIAGNOSIS — Z818 Family history of other mental and behavioral disorders: Secondary | ICD-10-CM

## 2023-05-08 DIAGNOSIS — R140 Abdominal distension (gaseous): Secondary | ICD-10-CM

## 2023-05-08 DIAGNOSIS — Z1211 Encounter for screening for malignant neoplasm of colon: Secondary | ICD-10-CM

## 2023-05-08 DIAGNOSIS — E559 Vitamin D deficiency, unspecified: Secondary | ICD-10-CM | POA: Diagnosis not present

## 2023-05-08 DIAGNOSIS — Z1231 Encounter for screening mammogram for malignant neoplasm of breast: Secondary | ICD-10-CM

## 2023-05-08 DIAGNOSIS — Z13220 Encounter for screening for lipoid disorders: Secondary | ICD-10-CM

## 2023-05-08 NOTE — Patient Instructions (Addendum)
 It was great seeing you today!  Plan discussed at today's visit: -Blood work ordered today, results will be uploaded to MyChart.  -Referrals for GI, Ophthalmology, Nutrition and Genetics all placed - they should call you within 2 weeks to schedule, if you don't hear anything call back here -Call to schedule mammogram   Follow up in: 1 month   Take care and let us know if you have any questions or concerns prior to your next visit.  Dr. Caralee Ates  Diabetes Mellitus and Nutrition, Adult When you have diabetes, or diabetes mellitus, it is very important to have healthy eating habits because your blood sugar (glucose) levels are greatly affected by what you eat and drink. Eating healthy foods in the right amounts, at about the same times every day, can help you: Manage your blood glucose. Lower your risk of heart disease. Improve your blood pressure. Reach or maintain a healthy weight. What can affect my meal plan? Every person with diabetes is different, and each person has different needs for a meal plan. Your health care provider may recommend that you work with a dietitian to make a meal plan that is best for you. Your meal plan may vary depending on factors such as: The calories you need. The medicines you take. Your weight. Your blood glucose, blood pressure, and cholesterol levels. Your activity level. Other health conditions you have, such as heart or kidney disease. How do carbohydrates affect me? Carbohydrates, also called carbs, affect your blood glucose level more than any other type of food. Eating carbs raises the amount of glucose in your blood. It is important to know how many carbs you can safely have in each meal. This is different for every person. Your dietitian can help you calculate how many carbs you should have at each meal and for each snack. How does alcohol affect me? Alcohol can cause a decrease in blood glucose (hypoglycemia), especially if you use insulin or  take certain diabetes medicines by mouth. Hypoglycemia can be a life-threatening condition. Symptoms of hypoglycemia, such as sleepiness, dizziness, and confusion, are similar to symptoms of having too much alcohol. Do not drink alcohol if: Your health care provider tells you not to drink. You are pregnant, may be pregnant, or are planning to become pregnant. If you drink alcohol: Limit how much you have to: 0-1 drink a day for women. 0-2 drinks a day for men. Know how much alcohol is in your drink. In the [place] one drink equals one 12 oz bottle of beer (355 mL), one 5 oz glass of wine (148 mL), or one 1 oz glass of hard liquor (44 mL). Keep yourself hydrated with water, diet soda, or unsweetened iced tea. Keep in mind that regular soda, juice, and other mixers may contain a lot of sugar and must be counted as carbs. What are tips for following this plan?  Reading food labels Start by checking the serving size on the Nutrition Facts label of packaged foods and drinks. The number of calories and the amount of carbs, fats, and other nutrients listed on the label are based on one serving of the item. Many items contain more than one serving per package. Check the total grams (g) of carbs in one serving. Check the number of grams of saturated fats and trans fats in one serving. Choose foods that have a low amount or none of these fats. Check the number of milligrams (mg) of salt (sodium) in one serving. Most people should limit  total sodium intake to less than 2,300 mg per day. Always check the nutrition information of foods labeled as "low-fat" or "nonfat." These foods may be higher in added sugar or refined carbs and should be avoided. Talk to your dietitian to identify your daily goals for nutrients listed on the label. Shopping Avoid buying canned, pre-made, or processed foods. These foods tend to be high in fat, sodium, and added sugar. Shop around the outside edge of the grocery store. This  is where you will most often find fresh fruits and vegetables, bulk grains, fresh meats, and fresh dairy products. Cooking Use low-heat cooking methods, such as baking, instead of high-heat cooking methods, such as deep frying. Cook using healthy oils, such as olive, canola, or sunflower oil. Avoid cooking with butter, cream, or high-fat meats. Meal planning Eat meals and snacks regularly, preferably at the same times every day. Avoid going long periods of time without eating. Eat foods that are high in fiber, such as fresh fruits, vegetables, beans, and whole grains. Eat 4-6 oz (112-168 g) of lean protein each day, such as lean meat, chicken, fish, eggs, or tofu. One ounce (oz) (28 g) of lean protein is equal to: 1 oz (28 g) of meat, chicken, or fish. 1 egg.  cup (62 g) of tofu. Eat some foods each day that contain healthy fats, such as avocado, nuts, seeds, and fish. What foods should I eat? Fruits Berries. Apples. Oranges. Peaches. Apricots. Plums. Grapes. Mangoes. Papayas. Pomegranates. Kiwi. Cherries. Vegetables Leafy greens, including lettuce, spinach, kale, chard, collard greens, mustard greens, and cabbage. Beets. Cauliflower. Broccoli. Carrots. Green beans. Tomatoes. Peppers. Onions. Cucumbers. Brussels sprouts. Grains Whole grains, such as whole-wheat or whole-grain bread, crackers, tortillas, cereal, and pasta. Unsweetened oatmeal. Quinoa. Brown or wild rice. Meats and other proteins Seafood. Poultry without skin. Lean cuts of poultry and beef. Tofu. Nuts. Seeds. Dairy Low-fat or fat-free dairy products such as milk, yogurt, and cheese. The items listed above may not be a complete list of foods and beverages you can eat and drink. Contact a dietitian for more information. What foods should I avoid? Fruits Fruits canned with syrup. Vegetables Canned vegetables. Frozen vegetables with butter or cream sauce. Grains Refined white flour and flour products such as bread, pasta,  snack foods, and cereals. Avoid all processed foods. Meats and other proteins Fatty cuts of meat. Poultry with skin. Breaded or fried meats. Processed meat. Avoid saturated fats. Dairy Full-fat yogurt, cheese, or milk. Beverages Sweetened drinks, such as soda or iced tea. The items listed above may not be a complete list of foods and beverages you should avoid. Contact a dietitian for more information. Questions to ask a health care provider Do I need to meet with a certified diabetes care and education specialist? Do I need to meet with a dietitian? What number can I call if I have questions? When are the best times to check my blood glucose? Where to find more information: American Diabetes Association: diabetes.org Academy of Nutrition and Dietetics: eatright.[employer] of Diabetes and Digestive and Kidney Diseases: StageSync.si Association of Diabetes Care & Education Specialists: diabeteseducator.org Summary It is important to have healthy eating habits because your blood sugar (glucose) levels are greatly affected by what you eat and drink. It is important to use alcohol carefully. A healthy meal plan will help you manage your blood glucose and lower your risk of heart disease. Your health care provider may recommend that you work with a dietitian to make a  meal plan that is best for you. This information is not intended to replace advice given to you by your health care provider. Make sure you discuss any questions you have with your health care provider. Document Revised: 08/27/2019 Document Reviewed: 08/28/2019 Elsevier Patient Education  2024 [employer].

## 2023-05-08 NOTE — Assessment & Plan Note (Signed)
 Had a long discussion about basic diabetic education and nutrition.  Patient has never been on medications for diabetes, last A1c was uncontrolled.  Will recheck A1c and microalbumin today.  Discussed definite possibility of starting medications but she is very interested in lifestyle and nutrition education, will place referral for diabetic nutritionist.  Plan to have her follow-up in 1 month to discuss medications.  Referral placed to ophthalmology for diabetic eye exam.

## 2023-05-08 NOTE — Assessment & Plan Note (Signed)
 Symptoms resolved with gluten-free diet, will check celiac panel today.

## 2023-05-08 NOTE — Progress Notes (Signed)
 New Patient Office Visit  Subjective    Patient ID: Cynthia Parsons, female    DOB: [birth year]  Age: 53 y.o. MRN: [id number]  CC:  Chief Complaint  Patient presents with   Establish Care    HPI Cynthia Parsons presents to establish care.  She had a different PCP in [place] but decided to move closer to where she lives.  Diabetes, Type 2: -Last A1c 5/24 11.4% -Medications: Nothing, has never been on medication. -Patient has a family history of diabetes in her mother, when the patient was hospitalized for COVID in 2020 and treated with antibiotics and steroids, her A1c was found to be 11.6% -Diet: Patient has a lot of questions about nutrition and her diet.  She tries to drink only water and will occasionally drink no sugar added juices.  A typical breakfast will look like eggs with peppers and onions and fruit.  She was told in the past that she had a wheat allergy, uncertain if she has celiac disease.  She tries to eat gluten-free exclusively, which has helped with her abdominal bloating, diarrhea and constipation -Eye exam: Due -Foot exam: Due, discussed at follow-up -Microalbumin: Due -Statin: No -PNA vaccine: Not up-to-date, discussed at follow-up -Denies symptoms of hypoglycemia, polyuria, polydipsia, numbness extremities, foot ulcers/trauma.   Wheat allergy: -Eating exclusively gluten-free foods which decrease abdominal bloating, diarrhea and constipation  Health Maintenance: -Blood work due -Mammogram due -Colon cancer screening due   Outpatient Encounter Medications as of 05/08/2023  Medication Sig   acetaminophen (TYLENOL) 650 MG CR tablet Take 1 tablet (650 mg total) by mouth every 8 (eight) hours as needed for pain.   [DISCONTINUED] cyclobenzaprine (FLEXERIL) 10 MG tablet One half to one tab PO qHS, then increase gradually to one tab TID. (Patient not taking: Reported on 05/08/2023)   [DISCONTINUED] nirmatrelvir & ritonavir (PAXLOVID, 150/100,) 10 x 150 MG  & 10 x 100MG  TBPK uase as directed per package instructions (Patient not taking: Reported on 05/08/2023)   [DISCONTINUED] trimethoprim-polymyxin b (POLYTRIM) ophthalmic solution Apply 1-2 drops into affected eye QID x 5 days. (Patient not taking: Reported on 05/08/2023)   No facility-administered encounter medications on file as of 05/08/2023.    Past Medical History:  Diagnosis Date   GERD (gastroesophageal reflux disease)    Gout 09/28/2017   Pneumonia     Past Surgical History:  Procedure Laterality Date   BREAST SURGERY     CHOLECYSTECTOMY      Family History  Problem Relation Age of Onset   Hypertension Mother    Diabetes Mother    Stroke Mother    Hypertension Father    Diabetes Father     Social History   Socioeconomic History   Marital status: Single    Spouse name: Not on file   Number of children: Not on file   Years of education: Not on file   Highest education level: Some college, no degree  Occupational History   Not on file  Tobacco Use   Smoking status: Never   Smokeless tobacco: Never  Vaping Use   Vaping status: Never Used  Substance and Sexual Activity   Alcohol use: No   Drug use: No   Sexual activity: Not on file  Other Topics Concern   Not on file  Social History Narrative   Not on file   Social Drivers of Health   Financial Resource Strain: Low Risk  (05/08/2023)   Overall Financial Resource Strain (CARDIA)  Difficulty of Paying Living Expenses: Not hard at all  Food Insecurity: No Food Insecurity (05/08/2023)   Hunger Vital Sign    Worried About Running Out of Food in the Last Year: Never true    Ran Out of Food in the Last Year: Never true  Transportation Needs: No Transportation Needs (05/08/2023)   PRAPARE - Administrator, Civil Service (Medical): No    Lack of Transportation (Non-Medical): No  Physical Activity: Sufficiently Active (05/08/2023)   Exercise Vital Sign    Days of Exercise per Week: 4 days    Minutes of  Exercise per Session: 40 min  Stress: No Stress Concern Present (05/08/2023)   [employer] of Occupational Health - Occupational Stress Questionnaire    Feeling of Stress : Not at all  Social Connections: Moderately Isolated (05/08/2023)   Social Connection and Isolation Panel [NHANES]    Frequency of Communication with Friends and Family: More than three times a week    Frequency of Social Gatherings with Friends and Family: More than three times a week    Attends Religious Services: More than 4 times per year    Active Member of [employer] or Organizations: No    Attends Engineer, structural: Not on file    Marital Status: Never married  Intimate Partner Violence: Not on file    Review of Systems  All other systems reviewed and are negative.       Objective    BP 136/84 (Cuff Size: Large)   Pulse 99   Temp 98.2 F (36.8 C)   Resp 16   Ht 5\' 6"  (1.676 m)   Wt 228 lb 14.4 oz (103.8 kg)   LMP 06/20/2022 (Approximate)   SpO2 96%   BMI 36.95 kg/m   Physical Exam Constitutional:      Appearance: Normal appearance.  HENT:     Head: Normocephalic and atraumatic.     Mouth/Throat:     Mouth: Mucous membranes are moist.     Pharynx: Oropharynx is clear.  Eyes:     Extraocular Movements: Extraocular movements intact.     Conjunctiva/sclera: Conjunctivae normal.     Pupils: Pupils are equal, round, and reactive to light.  Cardiovascular:     Rate and Rhythm: Normal rate and regular rhythm.  Pulmonary:     Effort: Pulmonary effort is normal.     Breath sounds: Normal breath sounds.  Musculoskeletal:     Right lower leg: No edema.     Left lower leg: No edema.  Skin:    General: Skin is warm and dry.  Neurological:     General: No focal deficit present.     Mental Status: She is alert. Mental status is at baseline.  Psychiatric:        Mood and Affect: Mood normal.        Behavior: Behavior normal.         Assessment & Plan:  Type 2 diabetes mellitus  with hyperglycemia, without long-term current use of insulin (HCC) Assessment & Plan: Had a long discussion about basic diabetic education and nutrition.  Patient has never been on medications for diabetes, last A1c was uncontrolled.  Will recheck A1c and microalbumin today.  Discussed definite possibility of starting medications but she is very interested in lifestyle and nutrition education, will place referral for diabetic nutritionist.  Plan to have her follow-up in 1 month to discuss medications.  Referral placed to ophthalmology for diabetic eye exam.  Orders: -  CBC with Differential/Platelet -     COMPLETE METABOLIC PANEL WITHOUT GFR -     Hemoglobin A1c -     Referral to Nutrition and Diabetes Services -     Microalbumin / creatinine urine ratio -     Ambulatory referral to Ophthalmology  Abdominal bloating Assessment & Plan: Symptoms resolved with gluten-free diet, will check celiac panel today.  Orders: -     Celiac Disease Panel  Family history of dementia Assessment & Plan: Her mother unfortunately passed away from creutzfeldt jakob disease, patient interested in genetic counseling regarding this. Referral placed.   Orders: -     Ambulatory referral to Genetics  Lipid screening -     Lipid panel  Need for hepatitis C screening test -     Hepatitis C antibody  Vitamin D deficiency -     VITAMIN D 25 Hydroxy (Vit-D Deficiency, Fractures) -     Magnesium -     Celiac Disease Panel  Encounter for screening mammogram for malignant neoplasm of breast -     3D Screening Mammogram, Left and Right; Future  Colon cancer screening -     Ambulatory referral to Gastroenterology    Return in about 4 weeks (around 06/05/2023).   Margarita Mail, DO

## 2023-05-08 NOTE — Assessment & Plan Note (Signed)
 Her mother unfortunately passed away from creutzfeldt jakob disease, patient interested in genetic counseling regarding this. Referral placed.

## 2023-05-09 LAB — MAGNESIUM: Magnesium: 1.5 mg/dL (ref 1.5–2.5)

## 2023-05-09 LAB — HEMOGLOBIN A1C
Hgb A1c MFr Bld: 10.3 %{Hb} — ABNORMAL HIGH (ref <5.7)
Mean Plasma Glucose: 249 mg/dL
eAG (mmol/L): 13.8 mmol/L

## 2023-05-09 LAB — LIPID PANEL
Cholesterol: 187 mg/dL (ref <200)
HDL: 52 mg/dL (ref >=50)
LDL Cholesterol (Calc): 106 mg/dL — ABNORMAL HIGH
Non-HDL Cholesterol (Calc): 135 mg/dL — ABNORMAL HIGH (ref <130)
Total CHOL/HDL Ratio: 3.6 (calc) (ref <5.0)
Triglycerides: 170 mg/dL — ABNORMAL HIGH (ref <150)

## 2023-05-09 LAB — COMPLETE METABOLIC PANEL WITHOUT GFR
AG Ratio: 1.3 (calc) (ref 1.0–2.5)
ALT: 21 U/L (ref 6–29)
AST: 18 U/L (ref 10–35)
Albumin: 4 g/dL (ref 3.6–5.1)
Alkaline phosphatase (APISO): 144 U/L (ref 37–153)
BUN: 10 mg/dL (ref 7–25)
CO2: 27 mmol/L (ref 20–32)
Calcium: 9.4 mg/dL (ref 8.6–10.4)
Chloride: 101 mmol/L (ref 98–110)
Creat: 0.85 mg/dL (ref 0.50–1.03)
Globulin: 3.1 g/dL (ref 1.9–3.7)
Glucose, Bld: 260 mg/dL — ABNORMAL HIGH (ref 65–99)
Potassium: 4.1 mmol/L (ref 3.5–5.3)
Sodium: 136 mmol/L (ref 135–146)
Total Bilirubin: 0.4 mg/dL (ref 0.2–1.2)
Total Protein: 7.1 g/dL (ref 6.1–8.1)

## 2023-05-09 LAB — CBC WITH DIFFERENTIAL/PLATELET
Absolute Lymphocytes: 2029 {cells}/uL (ref 850–3900)
Absolute Monocytes: 421 {cells}/uL (ref 200–950)
Basophils Absolute: 48 {cells}/uL (ref 0–200)
Basophils Relative: 0.7 %
Eosinophils Absolute: 90 {cells}/uL (ref 15–500)
Eosinophils Relative: 1.3 %
HCT: 43.8 % (ref 35.0–45.0)
Hemoglobin: 14.1 g/dL (ref 11.7–15.5)
MCH: 25.7 pg — ABNORMAL LOW (ref 27.0–33.0)
MCHC: 32.2 g/dL (ref 32.0–36.0)
MCV: 79.8 fL — ABNORMAL LOW (ref 80.0–100.0)
MPV: 10.7 fL (ref 7.5–12.5)
Monocytes Relative: 6.1 %
Neutro Abs: 4313 {cells}/uL (ref 1500–7800)
Neutrophils Relative %: 62.5 %
Platelets: 330 10*3/uL (ref 140–400)
RBC: 5.49 10*6/uL — ABNORMAL HIGH (ref 3.80–5.10)
RDW: 13 % (ref 11.0–15.0)
Total Lymphocyte: 29.4 %
WBC: 6.9 10*3/uL (ref 3.8–10.8)

## 2023-05-09 LAB — MICROALBUMIN / CREATININE URINE RATIO
Creatinine, Urine: 129 mg/dL (ref 20–275)
Microalb Creat Ratio: 4 mg/g{creat} (ref <30)
Microalb, Ur: 0.5 mg/dL

## 2023-05-09 LAB — VITAMIN D 25 HYDROXY (VIT D DEFICIENCY, FRACTURES): Vit D, 25-Hydroxy: 25 ng/mL — ABNORMAL LOW (ref 30–100)

## 2023-05-09 LAB — CELIAC DISEASE PANEL
(tTG) Ab, IgA: <1 U/mL
(tTG) Ab, IgG: <1 U/mL
Gliadin IgA: <1 U/mL
Gliadin IgG: <1 U/mL
Immunoglobulin A: 182 mg/dL (ref 47–310)

## 2023-05-09 LAB — HEPATITIS C ANTIBODY: Hepatitis C Ab: NONREACTIVE

## 2023-05-09 MED ORDER — VITAMIN D (ERGOCALCIFEROL) 1.25 MG (50000 UNIT) PO CAPS: 50000.0000 [IU] | ORAL_CAPSULE | ORAL | 0 refills | Status: DC

## 2023-05-09 NOTE — Addendum Note (Signed)
 Addended by: Margarita Mail on: 05/09/2023 04:18 PM   Modules accepted: Orders

## 2023-05-15 ENCOUNTER — Encounter: Payer: Self-pay | Admitting: Internal Medicine

## 2023-05-16 ENCOUNTER — Other Ambulatory Visit: Payer: Self-pay

## 2023-05-16 ENCOUNTER — Telehealth: Payer: Self-pay

## 2023-05-16 DIAGNOSIS — Z1211 Encounter for screening for malignant neoplasm of colon: Secondary | ICD-10-CM

## 2023-05-16 MED ORDER — NA SULFATE-K SULFATE-MG SULF 17.5-3.13-1.6 GM/177ML PO SOLN
1.0000 | Freq: Once | ORAL | 0 refills | Status: AC
Start: 2023-05-16 — End: 2023-05-16

## 2023-05-16 NOTE — Telephone Encounter (Signed)
 Gastroenterology Pre-Procedure Review  Request Date: 06/07/23 Requesting Physician: Dr. Tobi Bastos  PATIENT REVIEW QUESTIONS: The patient responded to the following health history questions as indicated:    1. Are you having any GI issues? no 2. Do you have a personal history of Polyps? no 3. Do you have a family history of Colon Cancer or Polyps? no 4. Diabetes Mellitus? no 5. Joint replacements in the past 12 months?no 6. Major health problems in the past 3 months?no 7. Any artificial heart valves, MVP, or defibrillator?no    MEDICATIONS & ALLERGIES:    Patient reports the following regarding taking any anticoagulation/antiplatelet therapy:   Plavix, Coumadin, Eliquis, Xarelto, Lovenox, Pradaxa, Brilinta, or Effient? no Aspirin? no  Patient confirms/reports the following medications:  Current Outpatient Medications  Medication Sig Dispense Refill   acetaminophen (TYLENOL) 650 MG CR tablet Take 1 tablet (650 mg total) by mouth every 8 (eight) hours as needed for pain.     Vitamin D, Ergocalciferol, (DRISDOL) 1.25 MG (50000 UNIT) CAPS capsule Take 1 capsule (50,000 Units total) by mouth every 7 (seven) days. 12 capsule 0   No current facility-administered medications for this visit.    Patient confirms/reports the following allergies:  Allergies  Allergen Reactions   Penicillins Hives    Did it involve swelling of the face/tongue/throat, SOB, or low BP? yes Did it involve sudden or severe rash/hives, skin peeling, or any reaction on the inside of your mouth or nose? yes Did you need to seek medical attention at a hospital or doctor's office? yes When did it last happen?  in her 30s If all above answers are "NO", may proceed with cephalosporin use.     No orders of the defined types were placed in this encounter.   AUTHORIZATION INFORMATION Primary Insurance: 1D#: Group #:  Secondary Insurance: 1D#: Group #:  SCHEDULE INFORMATION: Date: 06/07/23 Time: Location: [facility]

## 2023-05-17 ENCOUNTER — Encounter: Attending: Internal Medicine | Admitting: Dietician

## 2023-05-17 ENCOUNTER — Encounter: Payer: Self-pay | Admitting: Dietician

## 2023-05-17 VITALS — Ht 66.0 in | Wt 228.0 lb

## 2023-05-17 DIAGNOSIS — Z713 Dietary counseling and surveillance: Secondary | ICD-10-CM | POA: Diagnosis not present

## 2023-05-17 DIAGNOSIS — E1165 Type 2 diabetes mellitus with hyperglycemia: Secondary | ICD-10-CM | POA: Insufficient documentation

## 2023-05-17 NOTE — Patient Instructions (Signed)
 Resume eating a breakfast within 2 hours of getting up in the morning.  Include a protein food + a starch + a fruit and/or vegetable with each meal for healthy balance.  Continue with regular exercise, great job!! Work on improving sleep quality, which will also help with blood sugar control.

## 2023-05-17 NOTE — Progress Notes (Signed)
 Diabetes Self-Management Education  Visit Type: First/Initial  Appt. Start Time: 1045 Appt. End Time: 1215  05/17/2023  [name], identified by name and date of birth, is a 53 y.o. female with a diagnosis of Diabetes: Type 2.   ASSESSMENT  Height 5\' 6"  (1.676 Cynthia), weight 228 lb (103.4 kg), last menstrual period 06/20/2022. Body mass index is 36.8 kg/Cynthia.   Diabetes Self-Management Education - 05/17/23 1100       Visit Information   Visit Type First/Initial      Initial Visit   Diabetes Type Type 2    Date Diagnosed 2020, went into remission, redeveloped 2024    Are you currently following a meal plan? Yes   low carb   Are you taking your medications as prescribed? Yes      Health Coping   How would you rate your overall health? Fair      Psychosocial Assessment   Patient Belief/Attitude about Diabetes Motivated to manage diabetes    Self-care barriers None    Special Needs None    Learning Readiness Change in progress    How often do you need to have someone help you when you read instructions, pamphlets, or other written materials from your doctor or pharmacy? 1 - Never    What is the last grade level you completed in school? 14      Pre-Education Assessment   Patient understands the diabetes disease and treatment process. Needs Instruction    Patient understands incorporating nutritional management into lifestyle. Needs Instruction    Patient undertands incorporating physical activity into lifestyle. Needs Instruction    Patient understands using medications safely. Needs Review    Patient understands monitoring blood glucose, interpreting and using results Needs Review    Patient understands prevention, detection, and treatment of acute complications. Needs Instruction    Patient understands prevention, detection, and treatment of chronic complications. Needs Instruction    Patient understands how to develop strategies to address psychosocial issues. Needs  Instruction    Patient understands how to develop strategies to promote health/change behavior. Comprehends key points      Complications   Last HgB A1C per patient/outside source 10.3 %    How often do you check your blood sugar? 1-2 times/day    Fasting Blood glucose range (mg/dL) 161-096;>045   [phone]   Number of hypoglycemic episodes per month 0    Have you had a dilated eye exam in the past 12 months? No    Have you had a dental exam in the past 12 months? No    Are you checking your feet? Yes    How many days per week are you checking your feet? 5      Dietary Intake   Breakfast coffee (no sugar) and tea    Snack (morning) 12pm egg, fruit strawberries/ melon/ apple    Lunch salad/ raw veg; occasionally skips    Snack (afternoon) occasional sweet    Dinner 7:30 -8pm occ 9pm -- turkey/ chicken + lg portion vegetables    Snack (evening) occasional popcorn or peanut if up late    Beverage(s) water, coffee and tea in am, Diet 5 or Healhy Balance juice      Activity / Exercise   Activity / Exercise Type Light (walking / raking leaves)    How many days per week do you exercise? 3.5    How many minutes per day do you exercise? 30    Total minutes per week of  exercise 105      Patient Education   Previous Diabetes Education No    Disease Pathophysiology Definition of diabetes, type 1 and 2, and the diagnosis of diabetes;Explored patient's options for treatment of their diabetes;Factors that contribute to the development of diabetes    Healthy Eating Role of diet in the treatment of diabetes and the relationship between the three main macronutrients and blood glucose level;Food label reading, portion sizes and measuring food.;Plate Method;Reviewed blood glucose goals for pre and post meals and how to evaluate the patients' food intake on their blood glucose level.;Meal timing in regards to the patients' current diabetes medication.;Meal options for control of blood glucose level and  chronic complications.    Being Active Role of exercise on diabetes management, blood pressure control and cardiac health.    Monitoring Purpose and frequency of SMBG.;Taught/discussed recording of test results and interpretation of SMBG.;Identified appropriate SMBG and/or A1C goals.    Acute complications Taught prevention, symptoms, and  treatment of hypoglycemia - the 15 rule.;Discussed and identified patients' prevention, symptoms, and treatment of hyperglycemia.    Chronic complications Relationship between chronic complications and blood glucose control    Diabetes Stress and Support Role of stress on diabetes      Individualized Goals (developed by patient)   Nutrition Follow meal plan discussed    Physical Activity Exercise 3-5 times per week;30 minutes per day    Problem Solving Sleep Pattern      Outcomes   Expected Outcomes Demonstrated interest in learning. Expect positive outcomes    Future DMSE 2 months             Individualized Plan for Diabetes Self-Management Training:   Learning Objective:  Patient will have a greater understanding of diabetes self-management. Patient education plan is to attend individual and/or group sessions per assessed needs and concerns.   Plan:   Patient Instructions  Resume eating a breakfast within 2 hours of getting up in the morning.  Include a protein food + a starch + a fruit and/or vegetable with each meal for healthy balance.  Continue with regular exercise, great job!! Work on improving sleep quality, which will also help with blood sugar control.   Expected Outcomes:  Demonstrated interest in learning. Expect positive outcomes  Education material provided: How to Thrive Guide for Diabetes (ADA); plate planner with food lists; General Nutrition Guidelines for Diabetes; blood glucose record sheet; Carb-Smart Smoothie recipe template  If problems or questions, patient to contact team via:  Phone and patient portal  Future DSME  appointment: 2 months 07/12/23

## 2023-06-06 ENCOUNTER — Other Ambulatory Visit: Payer: Self-pay

## 2023-06-06 ENCOUNTER — Encounter: Payer: Self-pay | Admitting: Internal Medicine

## 2023-06-06 ENCOUNTER — Ambulatory Visit: Admitting: Internal Medicine

## 2023-06-06 ENCOUNTER — Telehealth: Payer: Self-pay | Admitting: Internal Medicine

## 2023-06-06 VITALS — BP 122/78 | HR 97 | Temp 97.8°F | Resp 16 | Ht 66.0 in | Wt 231.9 lb

## 2023-06-06 DIAGNOSIS — E1165 Type 2 diabetes mellitus with hyperglycemia: Secondary | ICD-10-CM | POA: Diagnosis not present

## 2023-06-06 MED ORDER — RYBELSUS 3 MG PO TABS
3.0000 mg | ORAL_TABLET | Freq: Every day | ORAL | 1 refills | Status: DC
Start: 2023-06-06 — End: 2023-12-05

## 2023-06-06 NOTE — Progress Notes (Signed)
 Established Patient Office Visit  Subjective    Patient ID: Cynthia Parsons, female    DOB: [birth year]  Age: 53 y.o. MRN: [id number]  CC:  Chief Complaint  Patient presents with   Follow-up    HPI Cynthia Parsons presents to follow up on diabetes.   Diabetes, Type 2: -Last A1c 10.3% 3/25 -Medications: Nothing, has never been on medication. -Patient has a family history of diabetes in her mother, when the patient was hospitalized for COVID in 2020 and treated with antibiotics and steroids, her A1c was found to be 11.6% -Diet: Patient has a lot of questions about nutrition and her diet.  She tries to drink only water and will occasionally drink no sugar added juices.  A typical breakfast will look like eggs with peppers and onions and fruit.  She was told in the past that she had a wheat allergy, uncertain if she has celiac disease.  She tries to eat gluten-free exclusively, which has helped with her abdominal bloating, diarrhea and constipation -Eye exam: Due at follow up -Foot exam: Due at follow up -Microalbumin: UTD 3/25 -Statin: No -PNA vaccine: Not up-to-date, discussed at follow-up -Denies symptoms of hypoglycemia, polyuria, polydipsia, numbness extremities, foot ulcers/trauma.   Wheat allergy: -Eating exclusively gluten-free foods which decrease abdominal bloating, diarrhea and constipation  Health Maintenance: -Blood work UTD -Mammogram due - ordered -Colon cancer screening due - colonoscopy scheduled for later this week  Outpatient Encounter Medications as of 06/06/2023  Medication Sig   acetaminophen  (TYLENOL ) 650 MG CR tablet Take 1 tablet (650 mg total) by mouth every 8 (eight) hours as needed for pain.   Vitamin D , Ergocalciferol , (DRISDOL ) 1.25 MG (50000 UNIT) CAPS capsule Take 1 capsule (50,000 Units total) by mouth every 7 (seven) days.   No facility-administered encounter medications on file as of 06/06/2023.    Past Medical History:  Diagnosis Date    GERD (gastroesophageal reflux disease)    Gout 09/28/2017   Pneumonia     Past Surgical History:  Procedure Laterality Date   BREAST SURGERY     CHOLECYSTECTOMY      Family History  Problem Relation Age of Onset   Hypertension Mother    Diabetes Mother    Stroke Mother    Hypertension Father    Diabetes Father     Social History   Socioeconomic History   Marital status: Single    Spouse name: Not on file   Number of children: Not on file   Years of education: Not on file   Highest education level: Some college, no degree  Occupational History   Not on file  Tobacco Use   Smoking status: Never   Smokeless tobacco: Never  Vaping Use   Vaping status: Never Used  Substance and Sexual Activity   Alcohol use: No   Drug use: No   Sexual activity: Not on file  Other Topics Concern   Not on file  Social History Narrative   Not on file   Social Drivers of Health   Financial Resource Strain: Low Risk  (05/08/2023)   Overall Financial Resource Strain (CARDIA)    Difficulty of Paying Living Expenses: Not hard at all  Food Insecurity: No Food Insecurity (05/08/2023)   Hunger Vital Sign    Worried About Running Out of Food in the Last Year: Never true    Ran Out of Food in the Last Year: Never true  Transportation Needs: No Transportation Needs (05/08/2023)   PRAPARE -  Administrator, Civil Service (Medical): No    Lack of Transportation (Non-Medical): No  Physical Activity: Sufficiently Active (05/08/2023)   Exercise Vital Sign    Days of Exercise per Week: 4 days    Minutes of Exercise per Session: 40 min  Stress: No Stress Concern Present (05/08/2023)   [employer] of Occupational Health - Occupational Stress Questionnaire    Feeling of Stress : Not at all  Social Connections: Moderately Isolated (05/08/2023)   Social Connection and Isolation Panel [NHANES]    Frequency of Communication with Friends and Family: More than three times a week     Frequency of Social Gatherings with Friends and Family: More than three times a week    Attends Religious Services: More than 4 times per year    Active Member of [employer] or Organizations: No    Attends Engineer, structural: Not on file    Marital Status: Never married  Intimate Partner Violence: Not on file    Review of Systems  All other systems reviewed and are negative.       Objective    BP 122/78 (Cuff Size: Large)   Pulse 97   Temp 97.8 F (36.6 C) (Oral)   Resp 16   Ht 5\' 6"  (1.676 m)   Wt 231 lb 14.4 oz (105.2 kg)   LMP 06/20/2022 (Approximate)   SpO2 97%   BMI 37.43 kg/m   Physical Exam Constitutional:      Appearance: Normal appearance.  HENT:     Head: Normocephalic and atraumatic.  Eyes:     Conjunctiva/sclera: Conjunctivae normal.  Cardiovascular:     Rate and Rhythm: Normal rate and regular rhythm.  Pulmonary:     Effort: Pulmonary effort is normal.     Breath sounds: Normal breath sounds.  Skin:    General: Skin is warm and dry.  Neurological:     General: No focal deficit present.     Mental Status: She is alert. Mental status is at baseline.  Psychiatric:        Mood and Affect: Mood normal.        Behavior: Behavior normal.         Assessment & Plan:   Assessment & Plan Type 2 Diabetes Mellitus with Hyperglycemia A1c at 10.3 indicates poor glycemic control. Intolerant to metformin  and glimepiride. Discussed both GLP-1's and SGLT2's in depth - Rybelsus chosen for initial trial due to oral preference. Discussed side effects and need for ongoing medication. Informed consent obtained. - Prescribe Rybelsus 3 mg for 30 days. Take with breakfast to minimize GI side effects. - Follow-up in one month to assess Rybelsus tolerance and efficacy. - Consider Mounjaro if Rybelsus ineffective or poorly tolerated. - Delay Rybelsus start until after colonoscopy. - Continue vitamin D  supplementation and acetaminophen  as needed.  - Semaglutide   (RYBELSUS) 3 MG TABS; Take 1 tablet (3 mg total) by mouth daily.  Dispense: 30 tablet; Refill: 1   Return in about 4 weeks (around 07/04/2023).   Rockney Cid, DO

## 2023-06-06 NOTE — Telephone Encounter (Signed)
 Semaglutide  (RYBELSUS) 3 MG TABS   Key: [id number]

## 2023-06-06 NOTE — Patient Instructions (Signed)
 Semaglutide  Tablets What is this medication? SEMAGLUTIDE  (SEM a GLOO tide) treats type 2 diabetes. It works by increasing insulin  levels in your body, which decreases your blood sugar (glucose). It also reduces the amount of sugar released into the blood and slows down your digestion. Changes to diet and exercise are often combined with this medication. This medicine may be used for other purposes; ask your health care provider or pharmacist if you have questions. COMMON BRAND NAME(S): Rybelsus What should I tell my care team before I take this medication? They need to know if you have any of these conditions: Endocrine tumors (MEN 2) or if someone in your family had these tumors Eye disease History of pancreatitis Kidney disease Stomach or intestine problems Thyroid cancer or if someone in your family had thyroid cancer Vision problems An unusual or allergic reaction to semaglutide , other medications, foods, dyes, or preservatives Pregnant or trying to get pregnant Breast-feeding How should I use this medication? Take this medication by mouth with a sip of water (no more than 4 ounces). Take it in the morning right after waking up every day. Take it on an empty stomach, at least 30 minutes before food, drink, or other medications. Do not cut, crush, or chew this medication. Swallow the tablets whole. Keep taking it unless your care team tells you to stop. A special MedGuide will be given to you by the pharmacist with each prescription and refill. Be sure to read this information carefully each time. Talk to your care team about the use of this medication in children. Special care may be needed. Overdosage: If you think you have taken too much of this medicine contact a poison control center or emergency room at once. NOTE: This medicine is only for you. Do not share this medicine with others. What if I miss a dose? If you miss a dose, skip it. Take your next dose at the normal time. Do not  take extra or 2 doses at the same time to make up for the missed dose. What may interact with this medication? Digoxin Levothyroxine Warfarin Some medications may affect your blood sugar levels or hide the symptoms of low blood sugar (hypoglycemia). Talk with your care team about all the medications you take. They may suggest changes to your insulin  dose or checking your blood sugar levels more often. Medications that may affect your blood sugar levels include: Alcohol Certain antibiotics, such as ciprofloxacin, levofloxacin , sulfamethoxazole; trimethoprim  Certain medications for blood pressure or heart disease, such as benazepril, enalapril, lisinopril , losartan, valsartan Certain medications for mental health conditions, such as fluoxetine or olanzapine Diuretics, such as hydrochlorothiazide (HCTZ) Estrogen and progestin hormones Other medications for diabetes Steroid medications, such as prednisone  or cortisone Testosterone Thyroid hormones Medications that may mask symptoms of low blood sugar include: Beta blockers, such as atenolol, metoprolol, propranolol Clonidine Guanethidine Reserpine This list may not describe all possible interactions. Give your health care provider a list of all the medicines, herbs, non-prescription drugs, or dietary supplements you use. Also tell them if you smoke, drink alcohol, or use illegal drugs. Some items may interact with your medicine. What should I watch for while using this medication? Visit your care team for regular checks on your progress. Check with your care team if you have severe diarrhea, nausea, and vomiting, or if you sweat a lot. The loss of too much body fluid may make it dangerous for you to take this medication. A test called the HbA1C (A1C) will be monitored.  This is a simple blood test. It measures your blood sugar control over the last 2 to 3 months. You will receive this test every 3 to 6 months. Learn how to check your blood  sugar. Learn the symptoms of low and high blood sugar and how to manage them. Always carry a quick-source of sugar with you in case you have symptoms of low blood sugar. Examples include hard sugar candy or glucose tablets. Make sure others know that you can choke if you eat or drink when you develop serious symptoms of low blood sugar, such as seizures or unconsciousness. Get medical help at once. Tell your care team if you have high blood sugar. You might need to change the dose of your medication. If you are sick or exercising more than usual, you might need to change the dose of your medication. Do not skip meals. Ask your care team if you should avoid alcohol. Many nonprescription cough and cold products contain sugar or alcohol. These can affect blood sugar. Wear a medical ID bracelet or chain. Carry a card that describes your condition. List the medications and doses you take on the card. What side effects may I notice from receiving this medication? Side effects that you should report to your care team as soon as possible: Allergic reactions--skin rash, itching, hives, swelling of the face, lips, tongue, or throat Change in vision Dehydration--increased thirst, dry mouth, feeling faint or lightheaded, headache, dark yellow or brown urine Gallbladder problems--severe stomach pain, nausea, vomiting, fever Heart palpitations--rapid, pounding, or irregular heartbeat Kidney injury--decrease in the amount of urine, swelling of the ankles, hands, or feet Pancreatitis--severe stomach pain that spreads to your back or gets worse after eating or when touched, fever, nausea, vomiting Thoughts of suicide or self-harm, worsening mood, feelings of depression Thyroid cancer--new mass or lump in the neck, pain or trouble swallowing, trouble breathing, hoarseness Side effects that usually do not require medical attention (report these to your care team if they continue or are bothersome): Diarrhea Loss of  appetite Nausea Upset stomach This list may not describe all possible side effects. Call your doctor for medical advice about side effects. You may report side effects to FDA at 1-800-FDA-1088. Where should I keep my medication? Keep out of the reach of children and pets. Store at room temperature between 20 and 25 degrees C (68 and 77 degrees F). Keep this medication in the original container. Protect from moisture. Keep the container tightly closed. Get rid of any unused medication after the expiration date. To get rid of medications that are no longer needed or have expired: Take the medication to a medication take-back program. Check with your pharmacy or law enforcement to find a location. If you cannot return the medication, check the label or package insert to see if the medication should be thrown out in the garbage or flushed down the toilet. If you are not sure, ask your care team. If it is safe to put it in the trash, take the medication out of the container. Mix the medication with cat litter, dirt, coffee grounds, or other unwanted substance. Seal the mixture in a bag or container. Put it in the trash. NOTE: This sheet is a summary. It may not cover all possible information. If you have questions about this medicine, talk to your doctor, pharmacist, or health care provider.  2024 Elsevier/Gold Standard (2023-01-06 00:00:00)

## 2023-06-07 ENCOUNTER — Ambulatory Visit
Admission: RE | Admit: 2023-06-07 | Discharge: 2023-06-07 | Disposition: A | Discharge status: Home / Self Care | Attending: Gastroenterology | Admitting: Gastroenterology

## 2023-06-07 ENCOUNTER — Encounter
Admission: RE | Disposition: A | Discharge status: Home / Self Care | Payer: Self-pay | Source: Home / Self Care | Attending: Gastroenterology

## 2023-06-07 ENCOUNTER — Encounter: Payer: Self-pay | Admitting: Gastroenterology

## 2023-06-07 ENCOUNTER — Ambulatory Visit: Admitting: Anesthesiology

## 2023-06-07 DIAGNOSIS — Z79899 Other long term (current) drug therapy: Secondary | ICD-10-CM | POA: Diagnosis not present

## 2023-06-07 DIAGNOSIS — K219 Gastro-esophageal reflux disease without esophagitis: Secondary | ICD-10-CM | POA: Insufficient documentation

## 2023-06-07 DIAGNOSIS — E119 Type 2 diabetes mellitus without complications: Secondary | ICD-10-CM | POA: Diagnosis not present

## 2023-06-07 DIAGNOSIS — Z1211 Encounter for screening for malignant neoplasm of colon: Secondary | ICD-10-CM | POA: Diagnosis present

## 2023-06-07 HISTORY — DX: Type 2 diabetes mellitus without complications: E11.9

## 2023-06-07 SURGERY — COLONOSCOPY
Anesthesia: General

## 2023-06-07 MED ORDER — PROPOFOL 500 MG/50ML IV EMUL
INTRAVENOUS | Status: DC | PRN
  Administered 2023-06-07: 75 ug/kg/min via INTRAVENOUS

## 2023-06-07 MED ORDER — PROPOFOL 10 MG/ML IV BOLUS
INTRAVENOUS | Status: DC | PRN
Start: 2023-06-07 — End: 2023-06-07
  Administered 2023-06-07: 50 mg via INTRAVENOUS
  Administered 2023-06-07: 20 mg via INTRAVENOUS

## 2023-06-07 MED ORDER — SODIUM CHLORIDE 0.9 % IV SOLN: INTRAVENOUS | Status: DC

## 2023-06-07 MED ORDER — LIDOCAINE HCL (CARDIAC) PF 100 MG/5ML IV SOSY
PREFILLED_SYRINGE | INTRAVENOUS | Status: DC | PRN
  Administered 2023-06-07: 80 mg via INTRAVENOUS

## 2023-06-07 MED ORDER — DEXMEDETOMIDINE HCL IN NACL 80 MCG/20ML IV SOLN
INTRAVENOUS | Status: DC | PRN
  Administered 2023-06-07: 20 ug via INTRAVENOUS

## 2023-06-07 NOTE — Transfer of Care (Signed)
 Immediate Anesthesia Transfer of Care Note  Patient: Cynthia Parsons  Procedure(s) Performed: COLONOSCOPY  Patient Location: PACU  Anesthesia Type:General  Level of Consciousness: sedated  Airway & Oxygen Therapy: Patient Spontanous Breathing  Post-op Assessment: Report given to RN and Post -op Vital signs reviewed and stable  Post vital signs: Reviewed and stable  Last Vitals:  Vitals Value Taken Time  BP    Temp    Pulse    Resp    SpO2      Last Pain:  Vitals:   06/07/23 1317  PainSc: 0-No pain         Complications: No notable events documented.

## 2023-06-07 NOTE — Op Note (Signed)
 [facility] Patient Name: Cynthia Parsons Procedure Date: 06/07/2023 1:54 PM MRN: [id number] Account #: [id number] Date of Birth: [birth year] Admit Type: Outpatient Age: 53 Room: [facility] ENDO ROOM 3 Gender: Female Note Status: Finalized Instrument Name: Charlyn Cooley [id number] Procedure:             Colonoscopy Indications:           Screening for colorectal malignant neoplasm Providers:             Luke Salaam MD, MD Referring MD:          Luke Salaam MD, MD (Referring MD), Rockney Cid                         (Referring MD) Medicines:             Propofol per Anesthesia, Monitored Anesthesia Care Complications:         No immediate complications. Procedure:             Pre-Anesthesia Assessment:                        - Prior to the procedure, a History and Physical was                         performed, and patient medications, allergies and                         sensitivities were reviewed. The patient's tolerance                         of previous anesthesia was reviewed.                        - The risks and benefits of the procedure and the                         sedation options and risks were discussed with the                         patient. All questions were answered and informed                         consent was obtained.                        - ASA Grade Assessment: II - A patient with mild                         systemic disease.                        After obtaining informed consent, the colonoscope was                         passed under direct vision. Throughout the procedure,                         the patient's blood pressure, pulse, and oxygen  saturations were monitored continuously. The                         Colonoscope was introduced through the anus and                         advanced to the the cecum, identified by the                         appendiceal orifice. The colonoscopy was  performed                         with ease. The patient tolerated the procedure well.                         The quality of the bowel preparation was excellent.                         The ileocecal valve, appendiceal orifice, and rectum                         were photographed. Findings:      The perianal and digital rectal examinations were normal.      The entire examined colon appeared normal on direct and retroflexion       views. Impression:            - The entire examined colon is normal on direct and                         retroflexion views.                        - No specimens collected. Recommendation:        - Discharge patient to home (with escort).                        - Resume previous diet.                        - Continue present medications.                        - Repeat colonoscopy in 10 years for screening                         purposes. Procedure Code(s):     --- Professional ---                        [phone], Colonoscopy, flexible; diagnostic, including                         collection of specimen(s) by brushing or washing, when                         performed (separate procedure) Diagnosis Code(s):     --- Professional ---                        Z12.11, Encounter for screening for malignant neoplasm  of colon CPT copyright 2022 American Medical Association. All rights reserved. The codes documented in this report are preliminary and upon coder review may  be revised to meet current compliance requirements. Luke Salaam, MD Luke Salaam MD, MD 06/07/2023 2:12:18 PM This report has been signed electronically. Number of Addenda: 0 Note Initiated On: 06/07/2023 1:54 PM Scope Withdrawal Time: 0 hours 6 minutes 33 seconds  Total Procedure Duration: 0 hours 8 minutes 30 seconds  Estimated Blood Loss:  Estimated blood loss: none.      [facility]

## 2023-06-07 NOTE — Anesthesia Preprocedure Evaluation (Signed)
 Anesthesia Evaluation  Patient identified by MRN, date of birth, ID band Patient awake    Reviewed: Allergy & Precautions, NPO status , Patient's Chart, lab work & pertinent test results  History of Anesthesia Complications (+) PONV and history of anesthetic complications  Airway Mallampati: III  TM Distance: <3 FB Neck ROM: full    Dental  (+) Chipped   Pulmonary neg pulmonary ROS, neg shortness of breath   Pulmonary exam normal        Cardiovascular Exercise Tolerance: Good (-) angina negative cardio ROS Normal cardiovascular exam     Neuro/Psych  Neuromuscular disease  negative psych ROS   GI/Hepatic Neg liver ROS,GERD  Controlled,,  Endo/Other  diabetes    Renal/GU negative Renal ROS  negative genitourinary   Musculoskeletal   Abdominal   Peds  Hematology negative hematology ROS (+)   Anesthesia Other Findings Past Medical History: No date: Diabetes mellitus without complication (HCC) No date: GERD (gastroesophageal reflux disease) 09/28/2017: Gout No date: Pneumonia  Past Surgical History: No date: BREAST SURGERY No date: CHOLECYSTECTOMY  BMI    Body Mass Index: 36.64 kg/m      Reproductive/Obstetrics negative OB ROS                             Anesthesia Physical Anesthesia Plan  ASA: 3  Anesthesia Plan: General   Post-op Pain Management:    Induction: Intravenous  PONV Risk Score and Plan: Propofol infusion and TIVA  Airway Management Planned: Natural Airway and Nasal Cannula  Additional Equipment:   Intra-op Plan:   Post-operative Plan:   Informed Consent: I have reviewed the patients History and Physical, chart, labs and discussed the procedure including the risks, benefits and alternatives for the proposed anesthesia with the patient or authorized representative who has indicated his/her understanding and acceptance.     Dental Advisory  Given  Plan Discussed with: Anesthesiologist, CRNA and Surgeon  Anesthesia Plan Comments: (Patient consented for risks of anesthesia including but not limited to:  - adverse reactions to medications - risk of airway placement if required - damage to eyes, teeth, lips or other oral mucosa - nerve damage due to positioning  - sore throat or hoarseness - Damage to heart, brain, nerves, lungs, other parts of body or loss of life  Patient voiced understanding and assent.)       Anesthesia Quick Evaluation

## 2023-06-07 NOTE — Telephone Encounter (Signed)
 PA sent to insurance. Waiting on response back

## 2023-06-07 NOTE — H&P (Signed)
 Luke Salaam, MD [address], [place], [place], [address] [address], [place], [place], [address] Phone: [phone]  Fax: [phone]  Primary Care Physician:  Rockney Cid, DO   Pre-Procedure History & Physical: HPI:  Cynthia Parsons is a 53 y.o. female is here for an colonoscopy.   Past Medical History:  Diagnosis Date   Diabetes mellitus without complication (HCC)    GERD (gastroesophageal reflux disease)    Gout 09/28/2017   Pneumonia     Past Surgical History:  Procedure Laterality Date   BREAST SURGERY     CHOLECYSTECTOMY      Prior to Admission medications   Medication Sig Start Date End Date Taking? Authorizing Provider  acetaminophen  (TYLENOL ) 650 MG CR tablet Take 1 tablet (650 mg total) by mouth every 8 (eight) hours as needed for pain. 11/03/22   Gean Keels, MD  Semaglutide  (RYBELSUS) 3 MG TABS Take 1 tablet (3 mg total) by mouth daily. 06/06/23   Rockney Cid, DO  Vitamin D , Ergocalciferol , (DRISDOL ) 1.25 MG (50000 UNIT) CAPS capsule Take 1 capsule (50,000 Units total) by mouth every 7 (seven) days. 05/09/23   Rockney Cid, DO    Allergies as of 05/16/2023 - Review Complete 05/16/2023  Allergen Reaction Noted   Penicillins Hives 12/29/2014    Family History  Problem Relation Age of Onset   Hypertension Mother    Diabetes Mother    Stroke Mother    Hypertension Father    Diabetes Father     Social History   Socioeconomic History   Marital status: Single    Spouse name: Not on file   Number of children: Not on file   Years of education: Not on file   Highest education level: Some college, no degree  Occupational History   Not on file  Tobacco Use   Smoking status: Never   Smokeless tobacco: Never  Vaping Use   Vaping status: Never Used  Substance and Sexual Activity   Alcohol use: No   Drug use: No   Sexual activity: Not on file  Other Topics Concern   Not on file  Social  History Narrative   Not on file   Social Drivers of Health   Financial Resource Strain: Low Risk  (05/08/2023)   Overall Financial Resource Strain (CARDIA)    Difficulty of Paying Living Expenses: Not hard at all  Food Insecurity: No Food Insecurity (05/08/2023)   Hunger Vital Sign    Worried About Running Out of Food in the Last Year: Never true    Ran Out of Food in the Last Year: Never true  Transportation Needs: No Transportation Needs (05/08/2023)   PRAPARE - Administrator, Civil Service (Medical): No    Lack of Transportation (Non-Medical): No  Physical Activity: Sufficiently Active (05/08/2023)   Exercise Vital Sign    Days of Exercise per Week: 4 days    Minutes of Exercise per Session: 40 min  Stress: No Stress Concern Present (05/08/2023)   [employer] of Occupational Health - Occupational Stress Questionnaire    Feeling of Stress : Not at all  Social Connections: Moderately Isolated (05/08/2023)   Social Connection and Isolation Panel [NHANES]    Frequency of Communication with Friends and Family: More than three times a week    Frequency of Social Gatherings with Friends and Family: More than three times a week    Attends Religious Services: More than 4 times per  year    Active Member of Clubs or Organizations: No    Attends Engineer, structural: Not on file    Marital Status: Never married  Intimate Partner Violence: Not on file    Review of Systems: See HPI, otherwise negative ROS  Physical Exam: BP 128/72   Pulse 92   Temp (!) 96.3 F (35.7 C)   Resp 17   Ht 5\' 6"  (1.676 m)   Wt 103 kg   LMP 06/20/2022 (Approximate)   SpO2 96%   BMI 36.64 kg/m  General:   Alert,  pleasant and cooperative in NAD Head:  Normocephalic and atraumatic. Neck:  Supple; no masses or thyromegaly. Lungs:  Clear throughout to auscultation, normal respiratory effort.    Heart:  +S1, +S2, Regular rate and rhythm, No edema. Abdomen:  Soft, nontender and  nondistended. Normal bowel sounds, without guarding, and without rebound.   Neurologic:  Alert and  oriented x4;  grossly normal neurologically.  Impression/Plan: Cynthia Parsons is here for an colonoscopy to be performed for Screening colonoscopy average risk   Risks, benefits, limitations, and alternatives regarding  colonoscopy have been reviewed with the patient.  Questions have been answered.  All parties agreeable.   Luke Salaam, MD  06/07/2023, 1:35 PM

## 2023-06-08 ENCOUNTER — Encounter: Payer: Self-pay | Admitting: Gastroenterology

## 2023-06-08 NOTE — Anesthesia Postprocedure Evaluation (Signed)
 Anesthesia Post Note  Patient: Hedwig B Escher  Procedure(s) Performed: COLONOSCOPY  Patient location during evaluation: PACU Anesthesia Type: General Level of consciousness: awake and alert Pain management: pain level controlled Vital Signs Assessment: post-procedure vital signs reviewed and stable Respiratory status: spontaneous breathing, nonlabored ventilation and respiratory function stable Cardiovascular status: blood pressure returned to baseline and stable Postop Assessment: no apparent nausea or vomiting Anesthetic complications: no   No notable events documented.   Last Vitals:  Vitals:   06/07/23 1433 06/07/23 1443  BP: (!) 113/58 (!) 105/59  Pulse: 75 71  Resp: 17 14  Temp:    SpO2: 100% 98%    Last Pain:  Vitals:   06/07/23 1443  TempSrc:   PainSc: 0-No pain                 Baltazar Bonier

## 2023-06-20 ENCOUNTER — Ambulatory Visit
Admission: RE | Admit: 2023-06-20 | Discharge: 2023-06-20 | Disposition: A | Discharge status: Home / Self Care | Source: Ambulatory Visit | Attending: Internal Medicine | Admitting: Internal Medicine

## 2023-06-20 DIAGNOSIS — Z1231 Encounter for screening mammogram for malignant neoplasm of breast: Secondary | ICD-10-CM | POA: Diagnosis present

## 2023-06-29 ENCOUNTER — Other Ambulatory Visit: Payer: Self-pay | Admitting: Internal Medicine

## 2023-06-29 DIAGNOSIS — E559 Vitamin D deficiency, unspecified: Secondary | ICD-10-CM

## 2023-06-30 NOTE — Telephone Encounter (Signed)
 Too soon for refill.  Requested Prescriptions  Pending Prescriptions Disp Refills   Vitamin D , Ergocalciferol , (DRISDOL ) 1.25 MG (50000 UNIT) CAPS capsule [Pharmacy Med Name: VITAMIN D2 1.25MG (50,000 UNIT)] 12 capsule 0    Sig: TAKE 1 CAPSULE (50,000 UNITS TOTAL) BY MOUTH EVERY 7 (SEVEN) DAYS     Endocrinology:  Vitamins - Vitamin D  Supplementation 2 Failed - 06/30/2023 12:00 PM      Failed - Manual Review: Route requests for 50,000 IU strength to the provider      Failed - Vitamin D  in normal range and within 360 days    Vit D, 25-Hydroxy  Date Value Ref Range Status  05/08/2023 25 (L) 30 - 100 ng/mL Final    Comment:    Vitamin D  Status         25-OH Vitamin D : . Deficiency:                    <20 ng/mL Insufficiency:             20 - 29 ng/mL Optimal:                 > or = 30 ng/mL . For 25-OH Vitamin D  testing on patients on  D2-supplementation and patients for whom quantitation  of D2 and D3 fractions is required, the QuestAssureD(TM) 25-OH VIT D, (D2,D3), LC/MS/MS is recommended: order  code 13086 (patients >103yrs). . See Note 1 . Note 1 . For additional information, please refer to  http://education.QuestDiagnostics.com/faq/FAQ199  (This link is being provided for informational/ educational purposes only.)          Passed - Ca in normal range and within 360 days    Calcium  Date Value Ref Range Status  05/08/2023 9.4 8.6 - 10.4 mg/dL Final   Calcium, Ion  Date Value Ref Range Status  01/07/2019 1.22 1.15 - 1.40 mmol/L Final         Passed - Valid encounter within last 12 months    Recent Outpatient Visits           3 weeks ago Type 2 diabetes mellitus with hyperglycemia, without long-term current use of insulin  [facility])   [facility] Rockney Cid, DO   1 month ago Type 2 diabetes mellitus with hyperglycemia, without long-term current use of insulin  [facility])   [facility] Rockney Cid, DO   6  months ago Primary osteoarthritis of left knee   [facility] at [facility], Joselyn Nicely, MD   7 months ago Primary osteoarthritis of left knee   [facility] at [facility], Joselyn Nicely, MD   9 months ago Radiculitis of left cervical region   [facility] Medicine at [facility], Joselyn Nicely, MD       Future Appointments             In 6 days Rockney Cid, DO [facility], [facility]

## 2023-07-06 ENCOUNTER — Ambulatory Visit: Admitting: Internal Medicine

## 2023-07-12 ENCOUNTER — Ambulatory Visit: Admitting: Dietician

## 2023-07-28 ENCOUNTER — Ambulatory Visit: Admitting: Internal Medicine

## 2023-08-02 ENCOUNTER — Ambulatory Visit: Admitting: Dietician

## 2023-08-04 ENCOUNTER — Other Ambulatory Visit: Payer: Self-pay | Admitting: Internal Medicine

## 2023-08-04 DIAGNOSIS — E559 Vitamin D deficiency, unspecified: Secondary | ICD-10-CM

## 2023-08-07 NOTE — Telephone Encounter (Signed)
 Requested medications are due for refill today.  yes  Requested medications are on the active medications list.  yes  Last refill. 05/09/2023 #12 0 rf  Future visit scheduled.   no  Notes to clinic.  Provider to review at this dosage.    Requested Prescriptions  Pending Prescriptions Disp Refills   Vitamin D , Ergocalciferol , (DRISDOL ) 1.25 MG (50000 UNIT) CAPS capsule [Pharmacy Med Name: VITAMIN D2 1.25MG (50,000 UNIT)] 12 capsule 1    Sig: Take 1 capsule (50,000 Units total) by mouth every 7 (seven) days.     Endocrinology:  Vitamins - Vitamin D  Supplementation 2 Failed - 08/07/2023  1:59 PM      Failed - Manual Review: Route requests for 50,000 IU strength to the provider      Failed - Vitamin D  in normal range and within 360 days    Vit D, 25-Hydroxy  Date Value Ref Range Status  05/08/2023 25 (L) 30 - 100 ng/mL Final    Comment:    Vitamin D  Status         25-OH Vitamin D : . Deficiency:                    <20 ng/mL Insufficiency:             20 - 29 ng/mL Optimal:                 > or = 30 ng/mL . For 25-OH Vitamin D  testing on patients on  D2-supplementation and patients for whom quantitation  of D2 and D3 fractions is required, the QuestAssureD(TM) 25-OH VIT D, (D2,D3), LC/MS/MS is recommended: order  code 07111 (patients >37yrs). . See Note 1 . Note 1 . For additional information, please refer to  http://education.QuestDiagnostics.com/faq/FAQ199  (This link is being provided for informational/ educational purposes only.)          Passed - Ca in normal range and within 360 days    Calcium  Date Value Ref Range Status  05/08/2023 9.4 8.6 - 10.4 mg/dL Final   Calcium, Ion  Date Value Ref Range Status  01/07/2019 1.22 1.15 - 1.40 mmol/L Final         Passed - Valid encounter within last 12 months    Recent Outpatient Visits           2 months ago Type 2 diabetes mellitus with hyperglycemia, without long-term current use of insulin  [facility])   [place]  [facility] Bernardo Fend, DO   3 months ago Type 2 diabetes mellitus with hyperglycemia, without long-term current use of insulin  [facility])   [facility] Bernardo Fend, DO   7 months ago Primary osteoarthritis of left knee   [facility] Primary Care & Sports Medicine at [facility], Debby PARAS, MD   9 months ago Primary osteoarthritis of left knee   [facility] Primary Care & Sports Medicine at [facility], Debby PARAS, MD   10 months ago Radiculitis of left cervical region   [facility] Primary Care & Sports Medicine at [facility], Debby PARAS, MD

## 2023-08-29 ENCOUNTER — Other Ambulatory Visit: Payer: Self-pay | Admitting: Internal Medicine

## 2023-08-29 DIAGNOSIS — E559 Vitamin D deficiency, unspecified: Secondary | ICD-10-CM

## 2023-08-31 NOTE — Telephone Encounter (Signed)
 Requested medication (s) are due for refill today: yes  Requested medication (s) are on the active medication list: yes  Last refill:  05/09/23  Future visit scheduled: no  Notes to clinic:  Manual Review: Route requests for 50,000 IU strength to the provider.     Requested Prescriptions  Pending Prescriptions Disp Refills   Vitamin D , Ergocalciferol , (DRISDOL ) 1.25 MG (50000 UNIT) CAPS capsule [Pharmacy Med Name: VITAMIN D2 1.25MG (50,000 UNIT)] 4 capsule 2    Sig: Take 1 capsule (50,000 Units total) by mouth every 7 (seven) days.     Endocrinology:  Vitamins - Vitamin D  Supplementation 2 Failed - 08/31/2023  3:34 PM      Failed - Manual Review: Route requests for 50,000 IU strength to the provider      Failed - Vitamin D  in normal range and within 360 days    Vit D, 25-Hydroxy  Date Value Ref Range Status  05/08/2023 25 (L) 30 - 100 ng/mL Final    Comment:    Vitamin D  Status         25-OH Vitamin D : . Deficiency:                    <20 ng/mL Insufficiency:             20 - 29 ng/mL Optimal:                 > or = 30 ng/mL . For 25-OH Vitamin D  testing on patients on  D2-supplementation and patients for whom quantitation  of D2 and D3 fractions is required, the QuestAssureD(TM) 25-OH VIT D, (D2,D3), LC/MS/MS is recommended: order  code 07111 (patients >9yrs). . See Note 1 . Note 1 . For additional information, please refer to  http://education.QuestDiagnostics.com/faq/FAQ199  (This link is being provided for informational/ educational purposes only.)          Passed - Ca in normal range and within 360 days    Calcium  Date Value Ref Range Status  05/08/2023 9.4 8.6 - 10.4 mg/dL Final   Calcium, Ion  Date Value Ref Range Status  01/07/2019 1.22 1.15 - 1.40 mmol/L Final         Passed - Valid encounter within last 12 months    Recent Outpatient Visits           2 months ago Type 2 diabetes mellitus with hyperglycemia, without long-term current use of insulin   [facility])   [facility] Bernardo Fend, DO   3 months ago Type 2 diabetes mellitus with hyperglycemia, without long-term current use of insulin  [facility])   [facility] Bernardo Fend, DO   8 months ago Primary osteoarthritis of left knee   [facility] at [facility], Debby PARAS, MD   10 months ago Primary osteoarthritis of left knee   [facility] Sports Medicine at [facility], Debby PARAS, MD   11 months ago Radiculitis of left cervical region   [facility] Primary Care & Sports Medicine at [facility], Debby PARAS, MD

## 2023-10-05 ENCOUNTER — Encounter: Payer: Self-pay | Admitting: Dietician

## 2023-10-05 NOTE — Progress Notes (Signed)
 Patient did no come to her scheduled appt on 08/02/23, and has not rescheduled.. Sent notification to referring provider.

## 2023-10-10 ENCOUNTER — Encounter: Payer: Self-pay | Admitting: Sports Medicine

## 2023-10-23 ENCOUNTER — Encounter: Attending: Internal Medicine | Admitting: Dietician

## 2023-10-23 DIAGNOSIS — Z713 Dietary counseling and surveillance: Secondary | ICD-10-CM | POA: Diagnosis not present

## 2023-10-23 DIAGNOSIS — E1165 Type 2 diabetes mellitus with hyperglycemia: Secondary | ICD-10-CM | POA: Diagnosis present

## 2023-10-23 NOTE — Progress Notes (Signed)
 Diabetes Self-Management Education  Visit Type:  Follow-up, virtual format  Type of connection:  live, two-way audio and video Patient verbally consents to this visit: yes Location of patient: home, private setting Location of provider: [place], [place] private office setting Other people present: none   Appt. Start Time: 1625 Appt. End Time: 1710  10/23/2023  Ms. [facility], identified by name and date of birth, is a 53 y.o. female with a diagnosis of Diabetes:  .   ASSESSMENT  Last menstrual period 06/20/2022. There is no height or weight on file to calculate BMI.    Diabetes Self-Management Education - 10/23/23 1732       Complications   How often do you check your blood sugar? 1-2 times/day    Fasting Blood glucose range (mg/dL) 819-799;>799   779-699   Postprandial Blood glucose range (mg/dL) 819-799;>799   779-699   Have you had a dilated eye exam in the past 12 months? No    Have you had a dental exam in the past 12 months? No    Are you checking your feet? Yes    How many days per week are you checking your feet? 5      Dietary Intake   Breakfast water, followed by ginger and vinegar shot; 1 hour later, coffee    Lunch 12pm brunch -- 2-3 egg omelet cooked with some veg + fruit    Snack (afternoon) cheese + fruit or a few crackers    Dinner 7-8:30pm chicken or [place] + vegetables (stir-fried)    Snack (evening) popcorn about 2x a week, otherwise none    Beverage(s) water, coffee and tea in am      Activity / Exercise   Activity / Exercise Type Light (walking / raking leaves)    How many days per week do you exercise? 3    How many minutes per day do you exercise? 30    Total minutes per week of exercise 90      Patient Education   Healthy Eating Meal timing in regards to the patients' current diabetes medication.;Meal options for control of blood glucose level and chronic complications.;Role of diet in the treatment of diabetes and the relationship  between the three main macronutrients and blood glucose level;Other (comment)   basic balanced eating pattern and nutrition   Medications Other (comment)   discussed likely need for diabetes medication, and medication options   Monitoring Taught/discussed recording of test results and interpretation of SMBG.    Diabetes Stress and Support Role of stress on diabetes      Individualized Goals (developed by patient)   Nutrition Other (comment)   start eating some solid food earlier in the day, try low carb smoothie with protein   Physical Activity Exercise 3-5 times per week    Problem Solving Eating Pattern      Subsequent Visit   Since your last visit have you continued or begun to take your medications as prescribed? Not on Medications    Since your last visit, are you checking your blood glucose at least once a day? Yes          Learning Objective:  Patient will have a greater understanding of diabetes self-management. Patient education plan is to attend individual and/or group sessions per assessed needs and concerns.   Plan:   Patient Instructions  Work on eating a light meal earlier in the day; OK to try a smoothie with 1 cup fruit, 1 cup veg, and a  protein (yogurt, protein powder, tofu, peanut butter, nuts) Include a small amount of healthy carb (like beans, peas, lentils, potato with skin, brown rice, whole grain bread or crackers) with each meal.    Expected Outcomes:  Demonstrated interest in learning. Expect positive outcomes  Education material provided: Plate planner with food lists, sample menus, carb-mindful smoothie recipe template sent by email  If problems or questions, patient to contact team via:  Phone, Email, and patient portal  Future DSME appointment: - 4-6 wks

## 2023-10-23 NOTE — Patient Instructions (Signed)
 Work on eating a light meal earlier in the day; OK to try a smoothie with 1 cup fruit, 1 cup veg, and a protein (yogurt, protein powder, tofu, peanut butter, nuts) Include a small amount of healthy carb (like beans, peas, lentils, potato with skin, brown rice, whole grain bread or crackers) with each meal.

## 2023-11-28 ENCOUNTER — Other Ambulatory Visit: Payer: Self-pay | Admitting: Emergency Medicine

## 2023-11-28 DIAGNOSIS — E119 Type 2 diabetes mellitus without complications: Secondary | ICD-10-CM

## 2023-11-28 DIAGNOSIS — E1165 Type 2 diabetes mellitus with hyperglycemia: Secondary | ICD-10-CM

## 2023-12-05 ENCOUNTER — Other Ambulatory Visit: Payer: Self-pay

## 2023-12-05 ENCOUNTER — Encounter: Payer: Self-pay | Admitting: Internal Medicine

## 2023-12-05 ENCOUNTER — Ambulatory Visit: Admitting: Internal Medicine

## 2023-12-05 ENCOUNTER — Ambulatory Visit

## 2023-12-05 VITALS — BP 126/84 | HR 102 | Temp 98.0°F | Resp 16 | Ht 66.0 in | Wt 232.2 lb

## 2023-12-05 DIAGNOSIS — E559 Vitamin D deficiency, unspecified: Secondary | ICD-10-CM | POA: Diagnosis not present

## 2023-12-05 DIAGNOSIS — L659 Nonscarring hair loss, unspecified: Secondary | ICD-10-CM | POA: Diagnosis not present

## 2023-12-05 DIAGNOSIS — Z7984 Long term (current) use of oral hypoglycemic drugs: Secondary | ICD-10-CM | POA: Diagnosis not present

## 2023-12-05 DIAGNOSIS — E1165 Type 2 diabetes mellitus with hyperglycemia: Secondary | ICD-10-CM

## 2023-12-05 LAB — POCT GLYCOSYLATED HEMOGLOBIN (HGB A1C): Hemoglobin A1C: 12.1 % — AB (ref 4.0–5.6)

## 2023-12-05 MED ORDER — SEMAGLUTIDE(0.25 OR 0.5MG/DOS) 2 MG/3ML ~~LOC~~ SOPN: 0.2500 mg | PEN_INJECTOR | SUBCUTANEOUS | 0 refills | Status: DC

## 2023-12-05 NOTE — Progress Notes (Signed)
 S:     Reason for visit: ?  Cynthia Parsons is a 53 y.o. female with a history of diabetes (type 2), who presents today for an initial diabetes Face to Face pharmacotherapy visit.?   Care Team: Primary Care Provider: Bernardo Fend, DO  Patient was seen by PCP today and reported she had been unable to receive Rybelsus  today from the pharmacy.   Current diabetes medications include: none Previous diabetes medications include: glipizide , metformin   Insurance coverage:  Medicaid  Patient denies any recent hypoglycemic events.  DM Prevention:  Statin: Not taking.?  History of chronic kidney disease? no History of albuminuria? no Last eye exam: DUE   Tobacco Use:  Tobacco Use: Low Risk  (12/05/2023)   Patient History    Smoking Tobacco Use: Never    Smokeless Tobacco Use: Never    Passive Exposure: Not on file   O:   Vitals:  Wt Readings from Last 3 Encounters:  12/05/23 232 lb 3.2 oz (105.3 kg)  06/07/23 227 lb (103 kg)  06/06/23 231 lb 14.4 oz (105.2 kg)   BP Readings from Last 3 Encounters:  12/05/23 126/84  06/07/23 (!) 105/59  06/06/23 122/78   Pulse Readings from Last 3 Encounters:  12/05/23 (!) 102  06/07/23 71  06/06/23 97     Labs:?  Lab Results  Component Value Date   HGBA1C 12.1 (A) 12/05/2023   HGBA1C 10.3 (H) 05/08/2023   HGBA1C 11.4 (A) 06/14/2022   GLUCOSE 260 (H) 05/08/2023   MICRALBCREAT 4 05/08/2023   CREATININE 0.85 05/08/2023   CREATININE 0.79 04/08/2019   CREATININE 0.70 01/07/2019    Lab Results  Component Value Date   CHOL 187 05/08/2023   LDLCALC 106 (H) 05/08/2023   LDLCALC 69 04/08/2019   HDL 52 05/08/2023   TRIG 170 (H) 05/08/2023   TRIG 75 04/08/2019   TRIG 82 12/28/2018   ALT 21 05/08/2023   ALT 17 04/08/2019   AST 18 05/08/2023   AST 18 04/08/2019      Chemistry      Component Value Date/Time   NA 136 05/08/2023 1522   NA 140 04/08/2019 0926   K 4.1 05/08/2023 1522   CL 101 05/08/2023 1522   CO2  27 05/08/2023 1522   BUN 10 05/08/2023 1522   BUN 7 04/08/2019 0926   CREATININE 0.85 05/08/2023 1522      Component Value Date/Time   CALCIUM 9.4 05/08/2023 1522   ALKPHOS 93 04/08/2019 0926   AST 18 05/08/2023 1522   ALT 21 05/08/2023 1522   BILITOT 0.4 05/08/2023 1522   BILITOT 0.2 04/08/2019 0926       The 10-year ASCVD risk score (Arnett DK, et al., 2019) is: 7.1%  Lab Results  Component Value Date   MICRALBCREAT 4 05/08/2023    A/P: Diabetes currently uncontrolled with a most recent A1c of 12.1% on 12/05/23, which is up from 10.3% on 05/08/23. Patient is able to verbalize appropriate hypoglycemia management plan.  Control is suboptimal due to difficulty obtaining medication therapies. Patient would be a good candidate for GLP1 therapy at this time and Trulicity and Ozempic  therapies preferred on Medicaid.  -Started GLP-1 Ozempic  (semaglutide ) 0.25 mg weekly -Patient educated on purpose, proper use, and potential adverse effects of Ozempic .  -Extensively discussed pathophysiology of diabetes, recommended lifestyle interventions, dietary effects on blood sugar control.  -Counseled on s/sx of and management of hypoglycemia.  -Next A1c anticipated 02/2024 -Thoroughly counseled on micro/macrovascular complications of diabetes.  -  Counseled to monitor fasting BG 2-3x per week  ASCVD risk - primary prevention in patient with diabetes. Last LDL is 106 mg/dL, not at goal of <29 mg/dL. 10-year ASCVD risk score of 7.1%. moderate intensity statin indicated. Will discuss initiation at follow up.  Patient verbalized understanding of treatment plan. Total time patient counseling 30 minutes.  Follow-up:  Pharmacist on 12/25/23 PCP clinic visit on 01/11/24  Peyton CHARLENA Ferries, PharmD Clinical Pharmacist [facility] [phone]

## 2023-12-05 NOTE — Progress Notes (Signed)
 Established Patient Office Visit  Subjective    Patient ID: Cynthia Parsons, female    DOB: 1970-12-17  Age: 53 y.o. MRN: [id number]  CC:  Chief Complaint  Patient presents with   Medical Management of Chronic Issues    HPI Cynthia Parsons presents to follow up on diabetes.   Discussed the use of AI scribe software for clinical note transcription with the patient, who gave verbal consent to proceed.  History of Present Illness Cynthia Parsons is a 53 year old female with diabetes who presents for management of elevated A1c levels.  Her A1c level has increased to 12.1% from 10.3% in March. She attempts to manage her diabetes through diet with a 'seventy thirty' approach but is not on medication due to insurance coverage issues with Rybelsus .  Her vitamin D  level was 25 ng/mL in March. She initially took a high-dose vitamin D  supplement for four to five weeks and now takes 5000 IU daily along with a multivitamin containing 800 IU of vitamin D .  She is concerned about low iron  levels and takes an iron  supplement. She also expresses concerns about potential weight loss with new diabetes medication and the logistics of storing and administering injectable medications.  She feels fine overall with no current symptoms.   Diabetes, Type 2: -Last A1c 10.3% 3/25 -Medications: Nothing, had been prescribed Rybelsus  but patient never took, states it was not covered -Patient has a family history of diabetes in her mother, when the patient was hospitalized for COVID in 2020 and treated with antibiotics and steroids, her A1c was found to be 11.6% -Diet: working on diet -Eye exam: Due  -Foot exam: Due today -Microalbumin: UTD 3/25 -Statin: No -PNA vaccine: Not up-to-date, discuss at follow-up -Denies symptoms of hypoglycemia, polyuria, polydipsia, numbness extremities, foot ulcers/trauma.   Wheat allergy: -Eating exclusively gluten-free foods which decrease abdominal bloating,  diarrhea and constipation  Health Maintenance: -Blood work UTD -Mammogram UTD 5/25 Birads-1 -Colon cancer screening: colonoscopy 4/25, repeat in 10 years  Outpatient Encounter Medications as of 12/05/2023  Medication Sig   acetaminophen  (TYLENOL ) 650 MG CR tablet Take 1 tablet (650 mg total) by mouth every 8 (eight) hours as needed for pain.   cholecalciferol (VITAMIN D3) 25 MCG (1000 UNIT) tablet Take 1,000 Units by mouth daily.   Semaglutide  (RYBELSUS ) 3 MG TABS Take 1 tablet (3 mg total) by mouth daily. (Patient not taking: Reported on 12/05/2023)   Vitamin D , Ergocalciferol , (DRISDOL ) 1.25 MG (50000 UNIT) CAPS capsule Take 1 capsule (50,000 Units total) by mouth every 7 (seven) days. (Patient not taking: Reported on 12/05/2023)   No facility-administered encounter medications on file as of 12/05/2023.    Past Medical History:  Diagnosis Date   Diabetes mellitus without complication (HCC)    GERD (gastroesophageal reflux disease)    Gout 09/28/2017   Pneumonia     Past Surgical History:  Procedure Laterality Date   BREAST SURGERY     CHOLECYSTECTOMY     COLONOSCOPY N/A 06/07/2023   Procedure: COLONOSCOPY;  Surgeon: Therisa Bi, MD;  Location: [facility] ENDOSCOPY;  Service: Gastroenterology;  Laterality: N/A;   REDUCTION MAMMAPLASTY      Family History  Problem Relation Age of Onset   Breast cancer Mother    Hypertension Mother    Diabetes Mother    Stroke Mother    Hypertension Father    Diabetes Father     Social History   Socioeconomic History   Marital status: Single  Spouse name: Not on file   Number of children: Not on file   Years of education: Not on file   Highest education level: Some college, no degree  Occupational History   Not on file  Tobacco Use   Smoking status: Never   Smokeless tobacco: Never  Vaping Use   Vaping status: Never Used  Substance and Sexual Activity   Alcohol use: No   Drug use: No   Sexual activity: Not on file  Other  Topics Concern   Not on file  Social History Narrative   Not on file   Social Drivers of Health   Financial Resource Strain: Low Risk  (05/08/2023)   Overall Financial Resource Strain (CARDIA)    Difficulty of Paying Living Expenses: Not hard at all  Food Insecurity: No Food Insecurity (05/08/2023)   Hunger Vital Sign    Worried About Running Out of Food in the Last Year: Never true    Ran Out of Food in the Last Year: Never true  Transportation Needs: No Transportation Needs (05/08/2023)   PRAPARE - Administrator, Civil Service (Medical): No    Lack of Transportation (Non-Medical): No  Physical Activity: Sufficiently Active (05/08/2023)   Exercise Vital Sign    Days of Exercise per Week: 4 days    Minutes of Exercise per Session: 40 min  Stress: No Stress Concern Present (05/08/2023)   [employer] of Occupational Health - Occupational Stress Questionnaire    Feeling of Stress : Not at all  Social Connections: Moderately Isolated (05/08/2023)   Social Connection and Isolation Panel    Frequency of Communication with Friends and Family: More than three times a week    Frequency of Social Gatherings with Friends and Family: More than three times a week    Attends Religious Services: More than 4 times per year    Active Member of [employer] or Organizations: No    Attends Engineer, Structural: Not on file    Marital Status: Never married  Intimate Partner Violence: Not on file    Review of Systems  All other systems reviewed and are negative.       Objective    BP 126/84 (Cuff Size: Large)   Pulse (!) 102   Temp 98 F (36.7 C) (Oral)   Resp 16   Ht 5' 6 (1.676 m)   Wt 232 lb 3.2 oz (105.3 kg)   LMP 06/20/2022 (Approximate)   SpO2 98%   BMI 37.48 kg/m   Physical Exam Constitutional:      Appearance: Normal appearance.  HENT:     Head: Normocephalic and atraumatic.  Eyes:     Conjunctiva/sclera: Conjunctivae normal.  Cardiovascular:      Rate and Rhythm: Normal rate and regular rhythm.     Pulses:          Dorsalis pedis pulses are 2+ on the right side and 2+ on the left side.  Pulmonary:     Effort: Pulmonary effort is normal.     Breath sounds: Normal breath sounds.  Musculoskeletal:     Right foot: Normal range of motion. No deformity, bunion, Charcot foot, foot drop or prominent metatarsal heads.     Left foot: Normal range of motion. No deformity, bunion, Charcot foot, foot drop or prominent metatarsal heads.  Feet:     Right foot:     Protective Sensation: 6 sites tested.  6 sites sensed.     Skin integrity: Skin integrity  normal.     Toenail Condition: Right toenails are normal.     Left foot:     Protective Sensation: 6 sites tested.  6 sites sensed.     Skin integrity: Skin integrity normal.     Toenail Condition: Left toenails are normal.  Skin:    General: Skin is warm and dry.  Neurological:     General: No focal deficit present.     Mental Status: She is alert. Mental status is at baseline.  Psychiatric:        Mood and Affect: Mood normal.        Behavior: Behavior normal.         Assessment & Plan:   Assessment & Plan  Type 2 diabetes mellitus with hyperglycemia A1c increased to 12.1% from 10.3%, indicating poor glycemic control. Current management insufficient. Discussed benefits of GLP-1 receptor agonists, including Ozempic , which is covered by Medicaid after trying Trulicity and Ozempic . GLP-1s enhance insulin  sensitivity and aid in weight loss. Ozempic  can reduce A1c, with plans to titrate dose based on response. - Order Ozempic  for glycemic control. - Schedule follow-up in 30 days to assess response to Ozempic . - Consider increasing dose after 30 days based on response. - Schedule follow-up in 3 months to recheck A1c and adjust treatment as needed.  Vitamin D  deficiency Vitamin D  was low at 25 ng/mL. Current supplementation of 5000 IU daily is appropriate until labs are rechecked in the  spring. - Continue current vitamin D  supplementation of 5000 IU daily. - Recheck vitamin D  levels in the spring.  Hair thinning Experiencing hair thinning. Current supplements unlikely to be effective. Discussed alternative treatments such as topical minoxidil. - Consider using topical minoxidil for hair growth.  Follow-Up Follow-up necessary to monitor response to new diabetes medication and adjust treatment as needed. - Schedule follow-up in 30 days to assess response to Ozempic . - Schedule follow-up in 3 months to recheck A1c and adjust treatment as needed.  - POCT HgB A1C - Semaglutide ,0.25 or 0.5MG /DOS, 2 MG/3ML SOPN; Inject 0.25 mg into the skin once a week.  Dispense: 3 mL; Refill: 0   Return in about 4 weeks (around 01/02/2024).   Sharyle Fischer, DO

## 2023-12-06 ENCOUNTER — Telehealth: Payer: Self-pay | Admitting: Pharmacy Technician

## 2023-12-06 ENCOUNTER — Other Ambulatory Visit (HOSPITAL_COMMUNITY): Payer: Self-pay

## 2023-12-06 NOTE — Telephone Encounter (Signed)
 Pharmacy Patient Advocate Encounter  Received notification from [facility] MEDICAID that Prior Authorization for Ozempic  (0.25 or 0.5 MG/DOSE) 2MG /3ML pen-injectors has been APPROVED from 12/06/23 to 12/05/24. Ran test claim, Copay is $4.00. This test claim was processed through [facility]- copay amounts may vary at other pharmacies due to pharmacy/plan contracts, or as the patient moves through the different stages of their insurance plan.   PA #/Case ID/Reference #: [id number]

## 2023-12-06 NOTE — Telephone Encounter (Signed)
 Pharmacy Patient Advocate Encounter   Received notification from CoverMyMeds that prior authorization for Ozempic  (0.25 or 0.5 MG/DOSE) 2MG /3ML pen-injectors is required/requested.   Insurance verification completed.   The patient is insured through [facility] MEDICAID.   Per test claim: PA required; PA submitted to above mentioned insurance via Latent Key/confirmation #/EOC [id number] Status is pending

## 2023-12-12 ENCOUNTER — Ambulatory Visit: Admitting: Internal Medicine

## 2023-12-25 ENCOUNTER — Ambulatory Visit

## 2023-12-25 NOTE — Progress Notes (Deleted)
 S:     Reason for visit: ?  Cynthia Parsons is a 53 y.o. female with a history of diabetes (type 2), who presents today for a follow up diabetes Face to Face pharmacotherapy visit.?   Known DM Complications: {DM complications:33329}   Care Team: Primary Care Provider: Bernardo Fend, DO  At last visit with PCP on 12/05/23, A1c was elevated at 12.1%. Patient was started on Ozempic  0.25 mg weekly at that time.   Patient reports Diabetes was diagnosed in ***.   Current diabetes medications include: Ozempic  0.25 mg weekly Previous diabetes medications include: metformin  Current hyperlipidemia medications include: ***  Patient reports adherence to taking all medications as prescribed.  *** Patient denies adherence with medications, reports missing *** medications *** times per week, on average.  Have you been experiencing any side effects to the medications prescribed? {YES NO:22349} Do you have any problems obtaining medications due to transportation or finances? {YES [id number] Insurance coverage: [place] Medicaid  Patient {Actions; denies-reports:120008} hypoglycemic events.  Reported home fasting blood sugars: ***  Reported 2 hour post-meal/random blood sugars: ***.  Patient {Actions; denies-reports:120008} nocturia (nighttime urination).  Patient {Actions; denies-reports:120008} neuropathy (nerve pain). Patient {Actions; denies-reports:120008} visual changes. Patient {Actions; denies-reports:120008} self foot exams.   Patient reported dietary habits: Eats *** meals/day Breakfast: *** Lunch: *** Dinner: *** Snacks: *** Drinks: ***  Patient-reported exercise habits: *** DM Prevention:  Statin: {Blank single:19197::***,Taking,Not taking,Intolerant to,Declines}; {Blank single:19197::low intensity,moderate intensity,high intensity,n/a}.?  ACE/ARB: no;  History of chronic kidney disease? {Blank single:19197::yes,no} Last urinary albumin/creatinine  ratio:  Lab Results  Component Value Date   MICRALBCREAT 4 05/08/2023   Last eye exam:     Last foot exam: 12/05/2023 Tobacco Use:  Tobacco Use: Low Risk  (12/05/2023)   Patient History    Smoking Tobacco Use: Never    Smokeless Tobacco Use: Never    Passive Exposure: Not on file   O:   Vitals:  Wt Readings from Last 3 Encounters:  12/05/23 232 lb 3.2 oz (105.3 kg)  06/07/23 227 lb (103 kg)  06/06/23 231 lb 14.4 oz (105.2 kg)   BP Readings from Last 3 Encounters:  12/05/23 126/84  06/07/23 (!) 105/59  06/06/23 122/78   Pulse Readings from Last 3 Encounters:  12/05/23 (!) 102  06/07/23 71  06/06/23 97     Labs:?  Lab Results  Component Value Date   HGBA1C 12.1 (A) 12/05/2023   HGBA1C 10.3 (H) 05/08/2023   HGBA1C 11.4 (A) 06/14/2022   GLUCOSE 260 (H) 05/08/2023   MICRALBCREAT 4 05/08/2023   CREATININE 0.85 05/08/2023   CREATININE 0.79 04/08/2019   CREATININE 0.70 01/07/2019    Lab Results  Component Value Date   CHOL 187 05/08/2023   LDLCALC 106 (H) 05/08/2023   LDLCALC 69 04/08/2019   HDL 52 05/08/2023   TRIG 170 (H) 05/08/2023   TRIG 75 04/08/2019   TRIG 82 12/28/2018   ALT 21 05/08/2023   ALT 17 04/08/2019   AST 18 05/08/2023   AST 18 04/08/2019      Chemistry      Component Value Date/Time   NA 136 05/08/2023 1522   NA 140 04/08/2019 0926   K 4.1 05/08/2023 1522   CL 101 05/08/2023 1522   CO2 27 05/08/2023 1522   BUN 10 05/08/2023 1522   BUN 7 04/08/2019 0926   CREATININE 0.85 05/08/2023 1522      Component Value Date/Time   CALCIUM 9.4 05/08/2023 1522   ALKPHOS 93  04/08/2019 0926   AST 18 05/08/2023 1522   ALT 21 05/08/2023 1522   BILITOT 0.4 05/08/2023 1522   BILITOT 0.2 04/08/2019 0926       The 10-year ASCVD risk score (Arnett DK, et al., 2019) is: 7.1%  Lab Results  Component Value Date   MICRALBCREAT 4 05/08/2023    A/P: Diabetes currently uncontrolled with a most recent A1c of 12.1% on 12/05/23. Patient is *** able  to verbalize appropriate hypoglycemia management plan. Medication adherence appears ***. Control is suboptimal due to ***. -{Meds adjust:18428} basal insulin  {basal insulins:33573}  *** units daily.  -{Meds adjust:18428} rapid insulin  {bolus insulin :33574} ***.  -{Meds adjust:18428} GLP-1 {GLP1 options:33572} *** mg .  -{Meds adjust:18428} SGLT2-I {SGLT2i options:33571}*** mg daily.  -{Meds adjust:18428} metformin  ***.  -Patient educated on purpose, proper use, and potential adverse effects of ***.  -Extensively discussed pathophysiology of diabetes, recommended lifestyle interventions, dietary effects on blood sugar control.  -Counseled on s/sx of and management of hypoglycemia.  -Next A1c anticipated ***.   ASCVD risk - primary prevention in patient with diabetes. Last LDL is 106 mg/dL, not at goal of <29 mg/dL. ASCVD risk factors include *** and 10-year ASCVD risk score of ***. {Desc; low/moderate/high:110033} intensity statin indicated.  -{Meds adjust:18428} {statin therapies:33576} *** mg daily.   {pharmacisttime:33368}  Follow-up:  Pharmacist on *** PCP clinic visit on 01/11/24  Peyton CHARLENA Ferries, PharmD, CPP Clinical Pharmacist [facility] [phone]

## 2023-12-29 ENCOUNTER — Telehealth: Payer: Self-pay

## 2023-12-29 ENCOUNTER — Other Ambulatory Visit: Payer: Self-pay | Admitting: Internal Medicine

## 2023-12-29 DIAGNOSIS — E1165 Type 2 diabetes mellitus with hyperglycemia: Secondary | ICD-10-CM

## 2023-12-29 NOTE — Progress Notes (Signed)
 Brief Telephone Documentation Reason for Call: Pharmacy Appointment Rescheduling  Summary of Call: Called patient 12/29/23. No answer, left voicemail with direct callback number.   Follow Up: Patient given direct line for further questions/concerns.  Rowin Bayron E. Marsh, PharmD Clinical Pharmacist [facility] [phone]

## 2023-12-31 NOTE — Telephone Encounter (Signed)
 Requested Prescriptions  Pending Prescriptions Disp Refills   Semaglutide ,0.25 or 0.5MG /DOS, (OZEMPIC , 0.25 OR 0.5 MG/DOSE,) 2 MG/3ML SOPN [Pharmacy Med Name: OZEMPIC  0.25-0.5 MG/DOSE PEN] 3 mL 0    Sig: INJECT 0.25MG  INTO THE SKIN ONE TIME PER WEEK     Endocrinology:  Diabetes - GLP-1 Receptor Agonists - semaglutide  Failed - 12/31/2023 11:20 AM      Failed - HBA1C in normal range and within 180 days    Hemoglobin A1C  Date Value Ref Range Status  12/05/2023 12.1 (A) 4.0 - 5.6 % Final   HbA1c, POC (controlled diabetic range)  Date Value Ref Range Status  06/14/2022 11.4 (A) 0.0 - 7.0 % Final   Hgb A1c MFr Bld  Date Value Ref Range Status  05/08/2023 10.3 (H) <5.7 % of total Hgb Final    Comment:    For someone without known diabetes, a hemoglobin A1c value of 6.5% or greater indicates that they may have  diabetes and this should be confirmed with a follow-up  test. . For someone with known diabetes, a value <7% indicates  that their diabetes is well controlled and a value  greater than or equal to 7% indicates suboptimal  control. A1c targets should be individualized based on  duration of diabetes, age, comorbid conditions, and  other considerations. . Currently, no consensus exists regarding use of hemoglobin A1c for diagnosis of diabetes for children. .          Passed - Cr in normal range and within 360 days    Creat  Date Value Ref Range Status  05/08/2023 0.85 0.50 - 1.03 mg/dL Final   Creatinine, Urine  Date Value Ref Range Status  05/08/2023 129 20 - 275 mg/dL Final         Passed - Valid encounter within last 6 months    Recent Outpatient Visits           3 weeks ago Type 2 diabetes mellitus with hyperglycemia, without long-term current use of insulin  [facility])   [facility] Bernardo Fend, DO   6 months ago Type 2 diabetes mellitus with hyperglycemia, without long-term current use of insulin  [facility])   [facility] Bernardo Fend, DO   7 months ago Type 2 diabetes mellitus with hyperglycemia, without long-term current use of insulin  [facility])   [facility] Bernardo Fend, DO   1 year ago Primary osteoarthritis of left knee   [facility], Debby PARAS, MD   1 year ago Primary osteoarthritis of left knee   [facility] at [facility], Debby PARAS, MD

## 2024-01-11 ENCOUNTER — Ambulatory Visit: Admitting: Internal Medicine

## 2024-01-11 ENCOUNTER — Encounter: Payer: Self-pay | Admitting: Internal Medicine

## 2024-01-11 VITALS — BP 124/82 | HR 100 | Temp 97.9°F | Resp 16 | Ht 66.0 in | Wt 232.4 lb

## 2024-01-11 DIAGNOSIS — E1165 Type 2 diabetes mellitus with hyperglycemia: Secondary | ICD-10-CM

## 2024-01-11 DIAGNOSIS — E559 Vitamin D deficiency, unspecified: Secondary | ICD-10-CM | POA: Diagnosis not present

## 2024-01-11 MED ORDER — OZEMPIC (0.25 OR 0.5 MG/DOSE) 2 MG/3ML ~~LOC~~ SOPN: 0.2500 mg | PEN_INJECTOR | SUBCUTANEOUS | 1 refills | Status: DC

## 2024-01-11 NOTE — Progress Notes (Signed)
 Established Patient Office Visit  Subjective    Patient ID: IA LEEB, female    DOB: Apr 08, 1970  Age: 53 y.o. MRN: [id number]  CC:  Chief Complaint  Patient presents with   Diabetes    HPI Meher B Gassner presents to follow up on diabetes.   Discussed the use of AI scribe software for clinical note transcription with the patient, who gave verbal consent to proceed.  History of Present Illness  EPHRATA VERVILLE is a 53 year old female who presents for a follow-up on Ozempic  treatment.  She reports that the current low dose of Ozempic  has lowered her blood sugars from near 300 to the 100s. She is now checking her glucose less often.  For the first two days after each Ozempic  dose she has queasiness and stomach discomfort. She also feels fatigued and has not taken her usual daily iron  supplement for about a week, which she thinks worsens the fatigue.  She takes an over-the-counter vitamin D  supplement daily despite disliking the taste. She reports some constipation.  Diabetes, Type 2: -Last A1c 12.1% 10/25 -Medications: Nothing, had been prescribed Rybelsus  but patient never took, states it was not covered -Patient has a family history of diabetes in her mother, when the patient was hospitalized for COVID in 2020 and treated with antibiotics and steroids, her A1c was found to be 11.6% -Diet: working on diet -Eye exam: UTD  -Foot exam: UTD -Microalbumin: UTD 3/25 -Statin: No -PNA vaccine: Not up-to-date, discuss at follow-up -Denies symptoms of hypoglycemia, polyuria, polydipsia, numbness extremities, foot ulcers/trauma.   Wheat allergy: -Eating exclusively gluten-free foods which decrease abdominal bloating, diarrhea and constipation  Health Maintenance: -Blood work UTD -Mammogram UTD 5/25 Birads-1 -Colon cancer screening: colonoscopy 4/25, repeat in 10 years  Outpatient Encounter Medications as of 01/11/2024  Medication Sig   Semaglutide ,0.25 or  0.5MG /DOS, (OZEMPIC , 0.25 OR 0.5 MG/DOSE,) 2 MG/3ML SOPN INJECT 0.25MG  INTO THE SKIN ONE TIME PER WEEK   acetaminophen  (TYLENOL ) 650 MG CR tablet Take 1 tablet (650 mg total) by mouth every 8 (eight) hours as needed for pain.   cholecalciferol (VITAMIN D3) 25 MCG (1000 UNIT) tablet Take 1,000 Units by mouth daily.   Vitamin D , Ergocalciferol , (DRISDOL ) 1.25 MG (50000 UNIT) CAPS capsule Take 1 capsule (50,000 Units total) by mouth every 7 (seven) days. (Patient not taking: Reported on 12/05/2023)   No facility-administered encounter medications on file as of 01/11/2024.    Past Medical History:  Diagnosis Date   Diabetes mellitus without complication (HCC)    GERD (gastroesophageal reflux disease)    Gout 09/28/2017   Pneumonia     Past Surgical History:  Procedure Laterality Date   BREAST SURGERY     CHOLECYSTECTOMY     COLONOSCOPY N/A 06/07/2023   Procedure: COLONOSCOPY;  Surgeon: Therisa Bi, MD;  Location: [facility] ENDOSCOPY;  Service: Gastroenterology;  Laterality: N/A;   REDUCTION MAMMAPLASTY      Family History  Problem Relation Age of Onset   Breast cancer Mother    Hypertension Mother    Diabetes Mother    Stroke Mother    Hypertension Father    Diabetes Father     Social History   Socioeconomic History   Marital status: Single    Spouse name: Not on file   Number of children: Not on file   Years of education: Not on file   Highest education level: Some college, no degree  Occupational History   Not on file  Tobacco  Use   Smoking status: Never   Smokeless tobacco: Never  Vaping Use   Vaping status: Never Used  Substance and Sexual Activity   Alcohol use: No   Drug use: No   Sexual activity: Not on file  Other Topics Concern   Not on file  Social History Narrative   Not on file   Social Drivers of Health   Financial Resource Strain: Low Risk  (05/08/2023)   Overall Financial Resource Strain (CARDIA)    Difficulty of Paying Living Expenses: Not hard at  all  Food Insecurity: No Food Insecurity (05/08/2023)   Hunger Vital Sign    Worried About Running Out of Food in the Last Year: Never true    Ran Out of Food in the Last Year: Never true  Transportation Needs: No Transportation Needs (05/08/2023)   PRAPARE - Administrator, Civil Service (Medical): No    Lack of Transportation (Non-Medical): No  Physical Activity: Sufficiently Active (05/08/2023)   Exercise Vital Sign    Days of Exercise per Week: 4 days    Minutes of Exercise per Session: 40 min  Stress: No Stress Concern Present (05/08/2023)   [employer] of Occupational Health - Occupational Stress Questionnaire    Feeling of Stress : Not at all  Social Connections: Moderately Isolated (05/08/2023)   Social Connection and Isolation Panel    Frequency of Communication with Friends and Family: More than three times a week    Frequency of Social Gatherings with Friends and Family: More than three times a week    Attends Religious Services: More than 4 times per year    Active Member of [employer] or Organizations: No    Attends Engineer, Structural: Not on file    Marital Status: Never married  Intimate Partner Violence: Not on file    Review of Systems  All other systems reviewed and are negative.       Objective    BP 124/82 (Cuff Size: Large)   Pulse 100   Temp 97.9 F (36.6 C) (Oral)   Resp 16   Ht 5' 6 (1.676 m)   Wt 232 lb 6.4 oz (105.4 kg)   LMP 06/20/2022 (Approximate)   SpO2 98%   BMI 37.51 kg/m   Physical Exam Constitutional:      Appearance: Normal appearance.  HENT:     Head: Normocephalic and atraumatic.  Eyes:     Conjunctiva/sclera: Conjunctivae normal.  Cardiovascular:     Rate and Rhythm: Normal rate and regular rhythm.  Pulmonary:     Effort: Pulmonary effort is normal.     Breath sounds: Normal breath sounds.  Skin:    General: Skin is warm and dry.  Neurological:     General: No focal deficit present.     Mental  Status: She is alert. Mental status is at baseline.  Psychiatric:        Mood and Affect: Mood normal.        Behavior: Behavior normal.         Assessment & Plan:   Assessment & Plan  Type 2 diabetes mellitus with hyperglycemia Significant improvement in blood glucose with low dose Ozempic . Initial side effects likely due to iron  deficiency. Insurance coverage for maintenance dose uncertain. - Refilled Ozempic  at low dose for two months. - Monitor insurance coverage for Ozempic  refills. - Schedule follow-up in early February to recheck A1c. - Instructed to discuss with pharmacist about leftover medication use.  Vitamin  D deficiency Taking daily over-the-counter vitamin D . High-dose prescription discontinued. Discussed risks of excessive intake. - Continue over-the-counter vitamin D  once daily. - Recheck vitamin D  levels during next lab work.  - Semaglutide ,0.25 or 0.5MG /DOS, (OZEMPIC , 0.25 OR 0.5 MG/DOSE,) 2 MG/3ML SOPN; Inject 0.25 mg into the skin once a week.  Dispense: 3 mL; Refill: 1  Return in about 2 months (around 03/13/2024).   Sharyle Fischer, DO

## 2024-01-15 ENCOUNTER — Ambulatory Visit

## 2024-02-05 ENCOUNTER — Ambulatory Visit (INDEPENDENT_AMBULATORY_CARE_PROVIDER_SITE_OTHER)

## 2024-02-05 DIAGNOSIS — Z7985 Long-term (current) use of injectable non-insulin antidiabetic drugs: Secondary | ICD-10-CM | POA: Diagnosis not present

## 2024-02-05 DIAGNOSIS — E1165 Type 2 diabetes mellitus with hyperglycemia: Secondary | ICD-10-CM | POA: Diagnosis not present

## 2024-02-05 NOTE — Progress Notes (Signed)
 "  S:     Reason for visit: ?  Cynthia Parsons is a 53 y.o. female with a history of diabetes (type 2), who presents today for a follow up diabetes Face to Face pharmacotherapy visit.?   They were referred to the pharmacist by their PCP for assistance in managing diabetes.  Care Team: Primary Care Provider: Bernardo Fend, DO  At last visit with PCP on 01/11/24, patient reported home BG readings have decreased from 300s to 100s. She endorsed some queasiness for the first 2 days following her Ozempic  injection.   Today, she reports some vision changes over the past 2 weeks and is scheduled to see a new eye doctor in January 2026. She reports hitting her right big toe on a chair and lost her toenail. It has been slowly growing back without issue.   Current diabetes medications include: Ozempic  0.25 mg weekly  Patient reports adherence to taking all medications as prescribed.   Have you been experiencing any side effects to the medications prescribed? No - reports nausea has resolved Do you have any problems obtaining medications due to transportation or finances? no Insurance coverage: [place] [employer]  Patient denies hypoglycemic events.  Reported home fasting blood sugars: 150s  Patient denies nocturia (nighttime urination).  Patient denies neuropathy (nerve pain). Patient reports visual changes. Patient reports self foot exams.   Patient reported dietary habits: Eats 2-3 meals/day Breakfast: veggie omelet Dinner: protein and veggies Snacks: fruit Drinks: water, unsweet tea, zero sugar creamer in coffee  Patient-reported exercise habits: watches her grandchildren daily  DM Prevention:  Statin: Not taking.?  ACE/ARB: no Last urinary albumin/creatinine ratio:  Lab Results  Component Value Date   MICRALBCREAT 4 05/08/2023   Last eye exam:     Last foot exam: 12/05/2023 Tobacco Use:  Tobacco Use: Low Risk (01/11/2024)   Patient History    Smoking Tobacco Use: Never     Smokeless Tobacco Use: Never    Passive Exposure: Not on file   O:   Vitals:  Wt Readings from Last 3 Encounters:  01/11/24 232 lb 6.4 oz (105.4 kg)  12/05/23 232 lb 3.2 oz (105.3 kg)  06/07/23 227 lb (103 kg)   BP Readings from Last 3 Encounters:  01/11/24 124/82  12/05/23 126/84  06/07/23 (!) 105/59   Pulse Readings from Last 3 Encounters:  01/11/24 100  12/05/23 (!) 102  06/07/23 71     Labs:?  Lab Results  Component Value Date   HGBA1C 12.1 (A) 12/05/2023   HGBA1C 10.3 (H) 05/08/2023   HGBA1C 11.4 (A) 06/14/2022   GLUCOSE 260 (H) 05/08/2023   MICRALBCREAT 4 05/08/2023   CREATININE 0.85 05/08/2023   CREATININE 0.79 04/08/2019   CREATININE 0.70 01/07/2019    Lab Results  Component Value Date   CHOL 187 05/08/2023   LDLCALC 106 (H) 05/08/2023   LDLCALC 69 04/08/2019   HDL 52 05/08/2023   TRIG 170 (H) 05/08/2023   TRIG 75 04/08/2019   TRIG 82 12/28/2018   ALT 21 05/08/2023   ALT 17 04/08/2019   AST 18 05/08/2023   AST 18 04/08/2019      Chemistry      Component Value Date/Time   NA 136 05/08/2023 1522   NA 140 04/08/2019 0926   K 4.1 05/08/2023 1522   CL 101 05/08/2023 1522   CO2 27 05/08/2023 1522   BUN 10 05/08/2023 1522   BUN 7 04/08/2019 0926   CREATININE 0.85 05/08/2023 1522  Component Value Date/Time   CALCIUM 9.4 05/08/2023 1522   ALKPHOS 93 04/08/2019 0926   AST 18 05/08/2023 1522   ALT 21 05/08/2023 1522   BILITOT 0.4 05/08/2023 1522   BILITOT 0.2 04/08/2019 0926       The 10-year ASCVD risk score (Arnett DK, et al., 2019) is: 6.8%  Lab Results  Component Value Date   MICRALBCREAT 4 05/08/2023    A/P: Diabetes currently uncontrolled with a most recent A1c of 12.1% on 12/05/23, however FBP is improved around 150 mg/dL. Patient is able to verbalize appropriate hypoglycemia management plan. Medication adherence appears appropriate. Patient reports concerns of vision changes over the past few weeks. Explained this could  possibly d/t rapid adjustments in BG levels or GLP1 therapy. She is scheduled to see a new ophthalmologist in 02/2024. BG is close to goal - suggested either a dose increase of Ozempic  or initiation of an SGLT2i. Patient requested to think on this and discuss at follow up in the new year.  -Continued GLP-1 Ozempic  (semaglutide ) 0.25 mg weekly -Patient educated on purpose, proper use, and potential adverse effects of SGLT2i and GLP1 therapies.  -Extensively discussed pathophysiology of diabetes, recommended lifestyle interventions, dietary effects on blood sugar control.  -Counseled on s/sx of and management of hypoglycemia.  -Next A1c anticipated 02/2024.   ASCVD risk - primary prevention in patient with diabetes. Last LDL is 106 mg/dL, not at goal of <29 mg/dL. 10-year ASCVD risk score of 7.1%. moderate intensity statin indicated. May discuss initiation at follow up.   Patient verbalized understanding of treatment plan. Total time patient counseling 30 minutes.  Follow-up:  Pharmacist in 2  months PCP clinic visit on 03/14/24  Peyton CHARLENA Ferries, PharmD, CPP Clinical Pharmacist [facility] [phone]   "

## 2024-03-01 ENCOUNTER — Other Ambulatory Visit

## 2024-03-04 ENCOUNTER — Other Ambulatory Visit: Payer: Self-pay

## 2024-03-04 DIAGNOSIS — Z111 Encounter for screening for respiratory tuberculosis: Secondary | ICD-10-CM

## 2024-03-07 ENCOUNTER — Ambulatory Visit: Payer: Self-pay | Admitting: Internal Medicine

## 2024-03-07 LAB — QUANTIFERON-TB GOLD PLUS
Mitogen-NIL: 7.01 [IU]/mL
NIL: 0.02 [IU]/mL
QuantiFERON-TB Gold Plus: NEGATIVE
TB1-NIL: 0 [IU]/mL
TB2-NIL: <0 [IU]/mL

## 2024-03-11 ENCOUNTER — Other Ambulatory Visit

## 2024-03-14 ENCOUNTER — Encounter: Payer: Self-pay | Admitting: Internal Medicine

## 2024-03-14 ENCOUNTER — Ambulatory Visit: Admitting: Internal Medicine

## 2024-03-14 ENCOUNTER — Other Ambulatory Visit: Payer: Self-pay

## 2024-03-14 ENCOUNTER — Other Ambulatory Visit

## 2024-03-14 VITALS — BP 126/76 | HR 94 | Temp 97.8°F | Resp 16 | Ht 66.0 in | Wt 231.2 lb

## 2024-03-14 DIAGNOSIS — Z7985 Long-term (current) use of injectable non-insulin antidiabetic drugs: Secondary | ICD-10-CM

## 2024-03-14 DIAGNOSIS — S161XXD Strain of muscle, fascia and tendon at neck level, subsequent encounter: Secondary | ICD-10-CM

## 2024-03-14 DIAGNOSIS — E1165 Type 2 diabetes mellitus with hyperglycemia: Secondary | ICD-10-CM

## 2024-03-14 LAB — GLUCOSE, POCT (MANUAL RESULT ENTRY): POC Glucose: 169 mg/dL — AB (ref 70–99)

## 2024-03-14 LAB — POCT GLYCOSYLATED HEMOGLOBIN (HGB A1C): Hemoglobin A1C: 7.7 % — AB (ref 4.0–5.6)

## 2024-03-14 MED ORDER — OZEMPIC (0.25 OR 0.5 MG/DOSE) 2 MG/3ML ~~LOC~~ SOPN: 0.2500 mg | PEN_INJECTOR | SUBCUTANEOUS | 0 refills | Status: AC

## 2024-03-14 MED ORDER — TIZANIDINE HCL 4 MG PO TABS: 4.0000 mg | ORAL_TABLET | Freq: Every evening | ORAL | 0 refills | Status: AC | PRN
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
## 2024-06-11 ENCOUNTER — Ambulatory Visit: Payer: Self-pay | Admitting: Internal Medicine
